# Patient Record
Sex: Female | Born: 2000 | Race: Black or African American | Hispanic: No | Marital: Single | Smoking: Never smoker
Health system: Southern US, Community
[De-identification: ages and names within clinical notes are randomized; demographics above are authoritative.]

## PROBLEM LIST (undated history)

## (undated) ENCOUNTER — Ambulatory Visit (HOSPITAL_COMMUNITY): Disposition: A | Discharge status: Home / Self Care

## (undated) DIAGNOSIS — Z789 Other specified health status: Secondary | ICD-10-CM

## (undated) HISTORY — DX: Other specified health status: Z78.9

## (undated) HISTORY — PX: NO PAST SURGERIES: SHX2092

---
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
## 2022-07-12 ENCOUNTER — Ambulatory Visit (INDEPENDENT_AMBULATORY_CARE_PROVIDER_SITE_OTHER)

## 2022-07-12 VITALS — BP 123/79 | HR 79 | Ht 66.0 in | Wt 135.2 lb

## 2022-07-12 DIAGNOSIS — Z3401 Encounter for supervision of normal first pregnancy, first trimester: Secondary | ICD-10-CM

## 2022-07-12 DIAGNOSIS — O3680X0 Pregnancy with inconclusive fetal viability, not applicable or unspecified: Secondary | ICD-10-CM

## 2022-07-12 DIAGNOSIS — Z3400 Encounter for supervision of normal first pregnancy, unspecified trimester: Secondary | ICD-10-CM | POA: Insufficient documentation

## 2022-07-12 NOTE — Progress Notes (Signed)
New OB Intake  I connected with  Katie Stevenson on 07/12/22 at  3:10 PM EDT by in person and verified that I am speaking with the correct person using two identifiers. Nurse is located at [facility] and pt is located at [place].  I discussed the limitations, risks, security and privacy concerns of performing an evaluation and management service by telephone and the availability of in person appointments. I also discussed with the patient that there may be a patient responsible charge related to this service. The patient expressed understanding and agreed to proceed.  I explained I am completing New OB Intake today. We discussed her EDD of 02/06/23 that is based on LMP of 05/02/22. Pt is G1/P0. I reviewed her allergies, medications, Medical/Surgical/OB history, and appropriate screenings. I informed her of [facility] services. [facility] information placed in AVS. Based on history, this is a/an  pregnancy uncomplicated .   There are no problems to display for this patient.   Concerns addressed today  Delivery Plans Plans to deliver at [facility]. Patient given information for [facility] Healthy [facility] website for more information about [facility]. Patient is not interested in water birth. Offered upcoming OB visit with CNM to discuss further.  MyChart/Babyscripts MyChart access verified. I explained pt will have some visits in office and some virtually. Babyscripts instructions given and order placed. Patient verifies receipt of registration text/e-mail. Account successfully created and app downloaded.  Blood Pressure Cuff/Weight Scale Patient has private insurance; instructed to purchase blood pressure cuff and bring to first prenatal appt. Explained after first prenatal appt pt will check weekly and document in Babyscripts. Patient does have weight scale. Patient has access to weight scale at home.  Anatomy US Explained first scheduled [place] will be around 19 weeks. Dating and viability scan performed  today. Anatomy US ordered. Date TBD.   Labs Discussed [name] genetic screening with patient. Would like both Panorama and Horizon drawn at new OB visit. Routine prenatal labs needed.  Covid Vaccine Patient has covid vaccine.   Is patient a CenteringPregnancy candidate?  Declined Declined due to Group Setting Not a candidate due to  Centering Patient" indicated on sticky note   Social Determinants of Health Food Insecurity: Patient denies food insecurity. WIC Referral: Patient is interested in referral to [facility].  Transportation: Patient denies transportation needs. Childcare: Discussed no children allowed at ultrasound appointments. Offered childcare services; patient declines childcare services at this time.  First visit review I reviewed new OB appt with pt. I explained she will have a provider visit that includes prenatal labs, std screening, genetic screening, and discuss plan of care for pregnancy.. Explained pt will be seen by Sharen Counter at first visit; encounter routed to appropriate provider. Explained that patient will be seen by pregnancy navigator following visit with provider.   Hamilton Capri, RN 07/12/2022  3:03 PM

## 2022-07-27 ENCOUNTER — Ambulatory Visit (INDEPENDENT_AMBULATORY_CARE_PROVIDER_SITE_OTHER): Admitting: Advanced Practice Midwife

## 2022-07-27 ENCOUNTER — Other Ambulatory Visit (HOSPITAL_COMMUNITY)
Admission: RE | Admit: 2022-07-27 | Discharge: 2022-07-27 | Disposition: A | Discharge status: Home / Self Care | Source: Ambulatory Visit | Attending: Advanced Practice Midwife | Admitting: Advanced Practice Midwife

## 2022-07-27 ENCOUNTER — Encounter: Payer: Self-pay | Admitting: Advanced Practice Midwife

## 2022-07-27 VITALS — BP 121/71 | HR 72 | Wt 134.3 lb

## 2022-07-27 DIAGNOSIS — Z3401 Encounter for supervision of normal first pregnancy, first trimester: Secondary | ICD-10-CM | POA: Diagnosis not present

## 2022-07-27 DIAGNOSIS — Z3A12 12 weeks gestation of pregnancy: Secondary | ICD-10-CM

## 2022-07-27 MED ORDER — ASPIRIN 81 MG PO CHEW: 81.0000 mg | CHEWABLE_TABLET | Freq: Every day | ORAL | 5 refills | Status: DC

## 2022-07-27 NOTE — Progress Notes (Signed)
Subjective:   Katie Stevenson is a 21 y.o. G1P0 at [redacted]w[redacted]d by LMP being seen today for her first obstetrical visit.  Her obstetrical history is significant for  none G1  and has Supervision of normal first teen pregnancy on their problem list.. Patient does intend to breast feed. Pregnancy history fully reviewed.  Patient reports no complaints.  HISTORY: OB History  Gravida Para Term Preterm AB Living  1 0 0 0 0 0  SAB IAB Ectopic Multiple Live Births  0 0 0 0 0    # Outcome Date GA Lbr Len/2nd Weight Sex Delivery Anes PTL Lv  1 Current            History reviewed. No pertinent past medical history. History reviewed. No pertinent surgical history. Family History  Problem Relation Age of Onset   Hypertension Mother    Hypertension Father    Diabetes Father    Social History   Tobacco Use   Smoking status: Never   Smokeless tobacco: Never  Vaping Use   Vaping Use: Never used  Substance Use Topics   Alcohol use: Never   Drug use: Never   No Known Allergies Current Outpatient Medications on File Prior to Visit  Medication Sig Dispense Refill   Prenatal Vit-Fe Fumarate-FA (PRENATAL VITAMINS PO) Take 1 tablet by mouth daily.     ondansetron (ZOFRAN ODT) 4 MG disintegrating tablet Take 1 tablet (4 mg total) by mouth every 8 (eight) hours as needed for nausea or vomiting. (Patient not taking: Reported on 07/27/2022) 20 tablet 0   No current facility-administered medications on file prior to visit.     Indications for ASA therapy (per uptodate) One of the following: Previous pregnancy with preeclampsia, especially early onset and with an adverse outcome No Multifetal gestation No Chronic hypertension No Type 1 or 2 diabetes mellitus No Chronic kidney disease No Autoimmune disease (antiphospholipid syndrome, systemic lupus erythematosus) No   Two or more of the following: Nulliparity Yes Obesity (body mass index >30 kg/m2) No Family history of preeclampsia in mother or  sister Yes Age ?35 years No Sociodemographic characteristics (African American race, low socioeconomic level) Yes Personal risk factors (eg, previous pregnancy with low birth weight or small for gestational age infant, previous adverse pregnancy outcome [eg, stillbirth], interval >10 years between pregnancies) No   Indications for early 1 hour GTT (per uptodate)  BMI >25 (>23 in Asian women) AND one of the following  Exam   Vitals:   07/27/22 1104  BP: 121/71  Pulse: 72  Weight: 134 lb 4.8 oz (60.9 kg)   Fetal Heart Rate (bpm): 164  Uterus:     Pelvic Exam: Perineum: no hemorrhoids, normal perineum   Vulva: normal external genitalia, no lesions   Vagina:  normal mucosa, normal discharge   Cervix: no lesions and normal, pap smear done.    Adnexa: normal adnexa and no mass, fullness, tenderness   Bony Pelvis: average  System: General: well-developed, well-nourished female in no acute distress   Breast:  normal appearance, no masses or tenderness   Skin: normal coloration and turgor, no rashes   Neurologic: oriented, normal, negative, normal mood   Extremities: normal strength, tone, and muscle mass, ROM of all joints is normal   HEENT PERRLA, extraocular movement intact and sclera clear, anicteric   Mouth/Teeth mucous membranes moist, pharynx normal without lesions and dental hygiene good   Neck supple and no masses   Cardiovascular: regular rate and rhythm  Respiratory:  no respiratory distress, normal breath sounds   Abdomen: soft, non-tender; bowel sounds normal; no masses,  no organomegaly     Assessment:   Pregnancy: G1P0 Patient Active Problem List   Diagnosis Date Noted   Supervision of normal first teen pregnancy 07/12/2022     Plan:  1. Supervision of normal first teen pregnancy in first trimester  - Panorama Prenatal Test Full Panel - [facility] Custom - Cervicovaginal ancillary only( [place]) - CBC/D/Plt+RPR+Rh+ABO+RubIgG... - Culture, OB Urine --BASA  for AA G1   Initial labs drawn. Continue prenatal vitamins. Discussed and offered genetic screening options, including Quad screen/AFP, NIPS testing, and option to decline testing. Benefits/risks/alternatives reviewed. Pt aware that anatomy US is form of genetic screening with lower accuracy in detecting trisomies than blood work.  Pt chooses genetic screening today. NIPS: requested. Ultrasound discussed; fetal anatomic survey: ordered. Problem list reviewed and updated. The nature of [facility] with multiple MDs and other Advanced Practice Providers was explained to patient; also emphasized that residents, students are part of our team. Routine obstetric precautions reviewed. No follow-ups on file.   Sharen Counter, CNM 07/27/22 11:33 AM

## 2022-07-28 LAB — CBC/D/PLT+RPR+RH+ABO+RUBIGG...
Antibody Screen: NEGATIVE
Basophils Absolute: 0 10*3/uL (ref 0.0–0.2)
Basos: 0 %
EOS (ABSOLUTE): 0 10*3/uL (ref 0.0–0.4)
Eos: 1 %
HCV Ab: NONREACTIVE
HIV Screen 4th Generation wRfx: NONREACTIVE
Hematocrit: 38.2 % (ref 34.0–46.6)
Hemoglobin: 12.5 g/dL (ref 11.1–15.9)
Hepatitis B Surface Ag: NEGATIVE
Immature Grans (Abs): 0 10*3/uL (ref 0.0–0.1)
Immature Granulocytes: 0 %
Lymphocytes Absolute: 1.6 10*3/uL (ref 0.7–3.1)
Lymphs: 24 %
MCH: 29.3 pg (ref 26.6–33.0)
MCHC: 32.7 g/dL (ref 31.5–35.7)
MCV: 90 fL (ref 79–97)
Monocytes Absolute: 0.3 10*3/uL (ref 0.1–0.9)
Monocytes: 5 %
Neutrophils Absolute: 4.7 10*3/uL (ref 1.4–7.0)
Neutrophils: 70 %
Platelets: 278 10*3/uL (ref 150–450)
RBC: 4.26 x10E6/uL (ref 3.77–5.28)
RDW: 15.1 % (ref 11.7–15.4)
RPR Ser Ql: NONREACTIVE
Rh Factor: NEGATIVE
Rubella Antibodies, IGG: 6.37 index (ref >0.99)
WBC: 6.7 10*3/uL (ref 3.4–10.8)

## 2022-07-28 LAB — CERVICOVAGINAL ANCILLARY ONLY
Chlamydia: NEGATIVE
Comment: NEGATIVE
Comment: NEGATIVE
Comment: NORMAL
Neisseria Gonorrhea: NEGATIVE
Trichomonas: NEGATIVE

## 2022-07-28 LAB — HCV INTERPRETATION

## 2022-07-30 LAB — URINE CULTURE, OB REFLEX

## 2022-07-30 LAB — CULTURE, OB URINE

## 2022-08-02 MED ORDER — NITROFURANTOIN MONOHYD MACRO 100 MG PO CAPS: 100.0000 mg | ORAL_CAPSULE | Freq: Two times a day (BID) | ORAL | 0 refills | Status: DC

## 2022-08-02 NOTE — Addendum Note (Signed)
Addended by: Sharen Counter A on: 08/02/2022 08:34 PM   Modules accepted: Orders

## 2022-08-03 ENCOUNTER — Telehealth: Payer: Self-pay | Admitting: *Deleted

## 2022-08-03 ENCOUNTER — Encounter: Payer: Self-pay | Admitting: Advanced Practice Midwife

## 2022-08-03 NOTE — Telephone Encounter (Signed)
TC from pt about panorama results. Does not want to know gender, just if the chromosomal testing was normal. Advised of low risk results. Advised pt to have her "gender reveal" person log in to [place].

## 2022-08-05 ENCOUNTER — Encounter: Payer: Self-pay | Admitting: Advanced Practice Midwife

## 2022-08-09 ENCOUNTER — Encounter: Payer: Self-pay | Admitting: Advanced Practice Midwife

## 2022-08-18 LAB — PANORAMA PRENATAL TEST FULL PANEL:PANORAMA TEST PLUS 5 ADDITIONAL MICRODELETIONS: FETAL FRACTION: 8.2

## 2022-08-18 LAB — HORIZON CUSTOM: REPORT SUMMARY: NEGATIVE

## 2022-08-24 ENCOUNTER — Encounter: Payer: Self-pay | Admitting: Advanced Practice Midwife

## 2022-08-24 ENCOUNTER — Ambulatory Visit (INDEPENDENT_AMBULATORY_CARE_PROVIDER_SITE_OTHER): Admitting: Advanced Practice Midwife

## 2022-08-24 VITALS — BP 122/76 | HR 74 | Wt 132.6 lb

## 2022-08-24 DIAGNOSIS — Z3401 Encounter for supervision of normal first pregnancy, first trimester: Secondary | ICD-10-CM

## 2022-08-24 DIAGNOSIS — Z3A19 19 weeks gestation of pregnancy: Secondary | ICD-10-CM

## 2022-08-24 DIAGNOSIS — Z3A16 16 weeks gestation of pregnancy: Secondary | ICD-10-CM

## 2022-08-24 NOTE — Progress Notes (Signed)
   PRENATAL VISIT NOTE  Subjective:  Katie Stevenson is a 21 y.o. G1P0 at [redacted]w[redacted]d being seen today for ongoing prenatal care.  She is currently monitored for the following issues for this low-risk pregnancy and has Supervision of normal first teen pregnancy on their problem list.  Patient reports no complaints.  Contractions: Not present. Vag. Bleeding: None.  Movement: Absent. Denies leaking of fluid.   The following portions of the patient's history were reviewed and updated as appropriate: allergies, current medications, past family history, past medical history, past social history, past surgical history and problem list.   Objective:   Vitals:   08/24/22 1525  BP: 122/76  Pulse: 74  Weight: 132 lb 9.6 oz (60.1 kg)    Fetal Status: Fetal Heart Rate (bpm): 153   Movement: Absent     General:  Alert, oriented and cooperative. Patient is in no acute distress.  Skin: Skin is warm and dry. No rash noted.   Cardiovascular: Normal heart rate noted  Respiratory: Normal respiratory effort, no problems with respiration noted  Abdomen: Soft, gravid, appropriate for gestational age.  Pain/Pressure: Absent     Pelvic: Cervical exam deferred        Extremities: Normal range of motion.  Edema: None  Mental Status: Normal mood and affect. Normal behavior. Normal judgment and thought content.   Assessment and Plan:  Pregnancy: G1P0 at [redacted]w[redacted]d 1. Supervision of normal first teen pregnancy in first trimester --Anticipatory guidance about next visits/weeks of pregnancy given.  - [place] MFM OB COMP + 14 WK; Future - AFP, Serum, Open Spina Bifida  2. [redacted] weeks gestation of pregnancy   Preterm labor symptoms and general obstetric precautions including but not limited to vaginal bleeding, contractions, leaking of fluid and fetal movement were reviewed in detail with the patient. Please refer to After Visit Summary for other counseling recommendations.   Return in about 4 weeks (around 09/21/2022) for LOB,  Any provider.  No future appointments.   Fatima Blank, CNM

## 2022-08-26 LAB — AFP, SERUM, OPEN SPINA BIFIDA
AFP MoM: 1.49
AFP Value: 60.5 ng/mL
Gest. Age on Collection Date: 16 weeks
Maternal Age At EDD: 19.6 yr
OSBR Risk 1 IN: 5617
Test Results:: NEGATIVE
Weight: 132 [lb_av]

## 2022-09-13 ENCOUNTER — Other Ambulatory Visit: Payer: Self-pay | Admitting: Advanced Practice Midwife

## 2022-09-13 ENCOUNTER — Ambulatory Visit: Attending: Advanced Practice Midwife

## 2022-09-13 ENCOUNTER — Other Ambulatory Visit: Payer: Self-pay | Admitting: *Deleted

## 2022-09-13 DIAGNOSIS — Z3402 Encounter for supervision of normal first pregnancy, second trimester: Secondary | ICD-10-CM | POA: Diagnosis not present

## 2022-09-13 DIAGNOSIS — O4442 Low lying placenta NOS or without hemorrhage, second trimester: Secondary | ICD-10-CM

## 2022-09-13 DIAGNOSIS — Z362 Encounter for other antenatal screening follow-up: Secondary | ICD-10-CM

## 2022-09-13 DIAGNOSIS — Z3A19 19 weeks gestation of pregnancy: Secondary | ICD-10-CM

## 2022-09-13 DIAGNOSIS — Z3401 Encounter for supervision of normal first pregnancy, first trimester: Secondary | ICD-10-CM | POA: Insufficient documentation

## 2022-09-13 DIAGNOSIS — Z3686 Encounter for antenatal screening for cervical length: Secondary | ICD-10-CM | POA: Insufficient documentation

## 2022-09-13 DIAGNOSIS — Z369 Encounter for antenatal screening, unspecified: Secondary | ICD-10-CM | POA: Insufficient documentation

## 2022-09-21 ENCOUNTER — Encounter: Payer: Self-pay | Admitting: Advanced Practice Midwife

## 2022-09-21 ENCOUNTER — Ambulatory Visit (INDEPENDENT_AMBULATORY_CARE_PROVIDER_SITE_OTHER): Admitting: Advanced Practice Midwife

## 2022-09-21 VITALS — BP 125/82 | HR 74 | Wt 137.8 lb

## 2022-09-21 DIAGNOSIS — Z3401 Encounter for supervision of normal first pregnancy, first trimester: Secondary | ICD-10-CM

## 2022-09-21 DIAGNOSIS — Z3A20 20 weeks gestation of pregnancy: Secondary | ICD-10-CM

## 2022-09-21 DIAGNOSIS — Z3402 Encounter for supervision of normal first pregnancy, second trimester: Secondary | ICD-10-CM

## 2022-09-21 NOTE — Progress Notes (Signed)
   PRENATAL VISIT NOTE  Subjective:  Katie Stevenson is a 21 y.o. G1P0 at [redacted]w[redacted]d being seen today for ongoing prenatal care.  She is currently monitored for the following issues for this low-risk pregnancy and has Supervision of normal first teen pregnancy on their problem list.  Patient reports no complaints.  Contractions: Not present. Vag. Bleeding: None.  Movement: Present. Denies leaking of fluid.   The following portions of the patient's history were reviewed and updated as appropriate: allergies, current medications, past family history, past medical history, past social history, past surgical history and problem list.   Objective:   Vitals:   09/21/22 1627  BP: 125/82  Pulse: 74  Weight: 137 lb 12.8 oz (62.5 kg)    Fetal Status: Fetal Heart Rate (bpm): 147 Fundal Height: 20 cm Movement: Present     General:  Alert, oriented and cooperative. Patient is in no acute distress.  Skin: Skin is warm and dry. No rash noted.   Cardiovascular: Normal heart rate noted  Respiratory: Normal respiratory effort, no problems with respiration noted  Abdomen: Soft, gravid, appropriate for gestational age.  Pain/Pressure: Absent     Pelvic: Cervical exam deferred        Extremities: Normal range of motion.  Edema: None  Mental Status: Normal mood and affect. Normal behavior. Normal judgment and thought content.   Assessment and Plan:  Pregnancy: G1P0 at [redacted]w[redacted]d 1. Supervision of normal first teen pregnancy in first trimester --Anticipatory guidance about next visits/weeks of pregnancy given.   2. [redacted] weeks gestation of pregnancy   Preterm labor symptoms and general obstetric precautions including but not limited to vaginal bleeding, contractions, leaking of fluid and fetal movement were reviewed in detail with the patient. Please refer to After Visit Summary for other counseling recommendations.   No follow-ups on file.  Future Appointments  Date Time Provider [place]  10/11/2022   2:30 PM [facility] NURSE [facility]  10/11/2022  2:45 PM [facility] US5 [facility]  10/18/2022  1:30 PM Leftwich-Kirby, Kathie Dike, CNM [facility] None    Fatima Blank, CNM

## 2022-10-11 ENCOUNTER — Other Ambulatory Visit: Payer: Self-pay | Admitting: *Deleted

## 2022-10-11 ENCOUNTER — Ambulatory Visit: Admitting: *Deleted

## 2022-10-11 ENCOUNTER — Encounter: Payer: Self-pay | Admitting: *Deleted

## 2022-10-11 ENCOUNTER — Ambulatory Visit: Attending: Obstetrics

## 2022-10-11 DIAGNOSIS — O4442 Low lying placenta NOS or without hemorrhage, second trimester: Secondary | ICD-10-CM | POA: Diagnosis present

## 2022-10-11 DIAGNOSIS — Z3A23 23 weeks gestation of pregnancy: Secondary | ICD-10-CM | POA: Diagnosis not present

## 2022-10-11 DIAGNOSIS — O365920 Maternal care for other known or suspected poor fetal growth, second trimester, not applicable or unspecified: Secondary | ICD-10-CM

## 2022-10-11 DIAGNOSIS — Z362 Encounter for other antenatal screening follow-up: Secondary | ICD-10-CM | POA: Diagnosis present

## 2022-10-18 ENCOUNTER — Encounter: Payer: Self-pay | Admitting: Advanced Practice Midwife

## 2022-10-18 ENCOUNTER — Ambulatory Visit (INDEPENDENT_AMBULATORY_CARE_PROVIDER_SITE_OTHER): Admitting: Advanced Practice Midwife

## 2022-10-18 VITALS — BP 122/77 | HR 72 | Wt 141.2 lb

## 2022-10-18 DIAGNOSIS — Z3A24 24 weeks gestation of pregnancy: Secondary | ICD-10-CM

## 2022-10-18 DIAGNOSIS — R21 Rash and other nonspecific skin eruption: Secondary | ICD-10-CM

## 2022-10-18 DIAGNOSIS — Z3401 Encounter for supervision of normal first pregnancy, first trimester: Secondary | ICD-10-CM

## 2022-10-18 MED ORDER — TRIAMCINOLONE ACETONIDE 0.5 % EX CREA: TOPICAL_CREAM | CUTANEOUS | 0 refills | Status: DC

## 2022-10-18 NOTE — Progress Notes (Signed)
Pt presents for ROB visit. Pt has a rash on her chest and upper back that showed up approx 1 week ago. Pt denies changes to laundry detergent or soap. No other concerns at this time.

## 2022-10-18 NOTE — Progress Notes (Signed)
   PRENATAL VISIT NOTE  Subjective:  Katie Stevenson is a 21 y.o. G1P0 at [redacted]w[redacted]d being seen today for ongoing prenatal care.  She is currently monitored for the following issues for this low-risk pregnancy and has Supervision of normal first teen pregnancy on their problem list.  Patient reports  rash .  Contractions: Not present. Vag. Bleeding: None.  Movement: Present. Denies leaking of fluid.   The following portions of the patient's history were reviewed and updated as appropriate: allergies, current medications, past family history, past medical history, past social history, past surgical history and problem list.   Objective:   Vitals:   10/18/22 1326  BP: 122/77  Pulse: 72  Weight: 141 lb 3.2 oz (64 kg)    Fetal Status: Fetal Heart Rate (bpm): 143 Fundal Height: 23 cm Movement: Present     General:  Alert, oriented and cooperative. Patient is in no acute distress.  Skin: Skin is warm and dry. No rash noted.   Cardiovascular: Normal heart rate noted  Respiratory: Normal respiratory effort, no problems with respiration noted  Abdomen: Soft, gravid, appropriate for gestational age.  Pain/Pressure: Absent     Pelvic: Cervical exam deferred        Extremities: Normal range of motion.  Edema: None  Mental Status: Normal mood and affect. Normal behavior. Normal judgment and thought content.   Assessment and Plan:  Pregnancy: G1P0 at [redacted]w[redacted]d 1. Supervision of normal first teen pregnancy in first trimester --Anticipatory guidance about next visits/weeks of pregnancy given.  --GTT at next visit  2. [redacted] weeks gestation of pregnancy   3. Rash and nonspecific skin eruption --Pt using topical hydrocortisone cream without improvement --Rash started 2 weeks ago, no itching or burning, just visible irregular papules on chest and back --Pt using unscented detergent, soap, lotion, no recent changes  - triamcinolone cream (KENALOG) 0.5 %; Use twice daily for 7 days.  Repeat for another 7 days  if needed.  Dispense: 30 g; Refill: 0   Preterm labor symptoms and general obstetric precautions including but not limited to vaginal bleeding, contractions, leaking of fluid and fetal movement were reviewed in detail with the patient. Please refer to After Visit Summary for other counseling recommendations.   Return in about 4 weeks (around 11/15/2022) for GTT at next visit.  Future Appointments  Date Time Provider Department Center  11/02/2022  8:30 AM [facility] NURSE [facility]  11/02/2022  8:45 AM [facility] US6 [facility]  11/16/2022  8:15 AM [facility] LAB [facility] None  11/16/2022  8:55 AM Leftwich-Kirby, Wilmer Floor, CNM [facility] None    Sharen Counter, CNM

## 2022-11-02 ENCOUNTER — Other Ambulatory Visit: Payer: Self-pay | Admitting: *Deleted

## 2022-11-02 ENCOUNTER — Ambulatory Visit: Attending: Obstetrics and Gynecology

## 2022-11-02 ENCOUNTER — Ambulatory Visit: Admitting: *Deleted

## 2022-11-02 VITALS — BP 114/66 | HR 62

## 2022-11-02 DIAGNOSIS — Z3A26 26 weeks gestation of pregnancy: Secondary | ICD-10-CM | POA: Insufficient documentation

## 2022-11-02 DIAGNOSIS — O283 Abnormal ultrasonic finding on antenatal screening of mother: Secondary | ICD-10-CM

## 2022-11-02 DIAGNOSIS — O358XX0 Maternal care for other (suspected) fetal abnormality and damage, not applicable or unspecified: Secondary | ICD-10-CM | POA: Diagnosis not present

## 2022-11-02 DIAGNOSIS — O4442 Low lying placenta NOS or without hemorrhage, second trimester: Secondary | ICD-10-CM | POA: Diagnosis not present

## 2022-11-02 DIAGNOSIS — Z362 Encounter for other antenatal screening follow-up: Secondary | ICD-10-CM | POA: Insufficient documentation

## 2022-11-02 DIAGNOSIS — Z3402 Encounter for supervision of normal first pregnancy, second trimester: Secondary | ICD-10-CM | POA: Insufficient documentation

## 2022-11-02 DIAGNOSIS — O09892 Supervision of other high risk pregnancies, second trimester: Secondary | ICD-10-CM

## 2022-11-02 DIAGNOSIS — O365920 Maternal care for other known or suspected poor fetal growth, second trimester, not applicable or unspecified: Secondary | ICD-10-CM | POA: Insufficient documentation

## 2022-11-03 ENCOUNTER — Ambulatory Visit (HOSPITAL_COMMUNITY): Admission: EM | Admit: 2022-11-03 | Discharge: 2022-11-03 | Discharge status: Against Medical Advice

## 2022-11-16 ENCOUNTER — Encounter: Payer: Self-pay | Admitting: Obstetrics and Gynecology

## 2022-11-16 ENCOUNTER — Ambulatory Visit (INDEPENDENT_AMBULATORY_CARE_PROVIDER_SITE_OTHER): Admitting: Obstetrics and Gynecology

## 2022-11-16 ENCOUNTER — Other Ambulatory Visit

## 2022-11-16 VITALS — BP 123/81 | HR 74 | Wt 148.6 lb

## 2022-11-16 DIAGNOSIS — Z3A28 28 weeks gestation of pregnancy: Secondary | ICD-10-CM

## 2022-11-16 DIAGNOSIS — Z3403 Encounter for supervision of normal first pregnancy, third trimester: Secondary | ICD-10-CM

## 2022-11-16 DIAGNOSIS — Z23 Encounter for immunization: Secondary | ICD-10-CM | POA: Diagnosis not present

## 2022-11-16 NOTE — Progress Notes (Signed)
Pt presents for ROB visit. Pt request flu and Tdap vaccine today. No concerns at this time.

## 2022-11-16 NOTE — Patient Instructions (Signed)
AREA PEDIATRIC/FAMILY PRACTICE PHYSICIANS  [place] ([address]) [facility] Chambliss, MD; Eniola, MD; Hale, MD; Hensel, MD; McDiarmid, MD; McIntyer, MD; Neal, MD; Walden, MD [address] [place], [place] [address] [phone] Mon-Fri 8:30-12:30, 1:30-5:00 Providers come to see babies at [facility] Accepting Medicaid [facility] providers who accept newborns: Koirala, MD; Morrow, MD; Wolters, MD [address], [place], [place] [address] [phone] Mon-Fri 8:00-5:30 Babies seen by providers at [facility] Does NOT accept Medicaid Please call early in hospitalization for appointment (limited availability)  [facility] Mulberry, MD [address] [place], [place] [address] [phone] Mon, Tue, Thur, Fri 8:30-5:00, Wed 10:00-7:00 (closed 1-2pm) Babies seen by [facility] providers Accepting Medicaid Rubin - Pediatrician Rubin, MD [address], [place], [place] [address] [phone] Mon-Fri 8:30-5:00, Sat 8:30-12:00 Provider comes to see babies at [facility] Accepting Medicaid Must have been referred from current patients or contacted office prior to delivery [facility] ([facility]) Brown, MD; Chandler, MD; Ettefagh, MD; Grant, MD; Lester, MD; McCormick, MD; McQueen, MD; Prose, MD; Simha, MD; Stanley, MD; Stryffeler, NP; Tebben, NP [address], [place], [place] [address] [phone] Mon, Tue, Thur, Fri 8:30-5:30, Wed 9:30-5:30, Sat 8:30-12:30 Babies seen by [facility] providers Accepting Medicaid Only accepting infants of first-time parents or siblings of current patients Hospital discharge coordinator will make follow-up appointment Jack Amos [address], [place], [place]  [address] [phone]   Fax - [phone] [facility] [address], [place], [place]  [address] Phone - [phone]   Fax - [phone] Shilpa Gosrani [address], [place], [place]  [address] [phone]  [place] ([address]) [facility] Bates, MD; [place]assfield, MD; Cooper, Cox, MD; MD; Davis, MD; Dovico, MD; Ettefaugh, MD; Little, MD; Lowe, MD; Keiffer, MD; Melvin, MD; Sumner, MD; Williams, MD [address], [place], [place] [address] [phone] Mon-Fri 8:30-5:00 (extended evenings Mon-Thur as needed), Sat-Sun 10:00-1:00 Providers come to see babies at [facility] Accepting Medicaid for families of first-time babies and families with all children in the household age 3 and under. Must register with office prior to making appointment (M-F only). [facility] Henson, NP; Knapp, MD; Lalonde, MD; Tysinger, PA [address] [place], [place] [address] [phone] Mon-Fri 8:00-5:00 Babies seen by providers at [facility] Does NOT accept Medicaid/Commercial Insurance Only [facility] ([facility])  Artis, MD; Barnes, MD; Bratton, MD; Coccaro, MD; [place] Gardner, MD; Kramer, MD; Marshall, MD; Netherton, MD; Poleto, MD; Skinner, MD [address] [place], [place] [address] [phone] Mon-Fri 8:30-5:30, Sat (Oct.-Mar.) 9:00-1:00 Babies seen by providers at [facility] Accepting Medicaid  [place] ([address]) [facility] Reid, MD; Warner, MD [address], [place], [place] [address] [phone] Mon-Fri 8:30-5:00, Sat 8:30-12:00 Providers come to see babies at [facility] Does NOT accept Medicaid [facility] Becker, PA; Hagler, MD; Scifres, PA; Sun, MD; Swayne, MD [address], [place], [place] [address] [phone] Mon-Fri 8:00-5:00 Babies seen by providers at [facility] Does NOT accept Medicaid Only accepting babies of parents who  are patients Please call early in hospitalization for appointment (limited availability) [facility] Clark, MD; Frye, MD; Kelleher, MD; Mack, NP; Miller, MD; O'Keller, MD; Patterson, NP; Pudlo, MD; Puzio, MD; Thomas, MD; Tucker, MD; Twiselton, MD [address], [place], [place] [address] [phone] Mon-Fri 8:00-5:00, Sat 9:00-12:00 Providers come to see babies at [facility] Does NOT accept Medicaid  [place] ([address]) [facility] providers accepting new patients: Brake, NP; Wharton, PA [address], [place], [place] [address] [phone] Mon-Fri 8:00-5:00 Babies seen by providers at [facility] Does NOT accept Medicaid Only accepting babies of parents who are patients Please call early in hospitalization for appointment (limited availability) [facility] Gay, MD; Quinlan, MD [address] [place], [place] [address] [phone] (press 1 to schedule appointment) Mon-Fri 8:00-5:00 Providers come to see babies at [facility] Does NOT accept Medicaid [facility] Mazer, MD [address] [place], [place] [address] [phone] Mon-Fri 8:30-5:00 (lunch 12:30-1:00), extended hours by appointment only Wed 5:00-6:30 Babies seen by [facility] providers Accepting Medicaid [facility] Banks, MD; Jordan, MD; Koberlein, MD [address], [place], [place] [address] [phone] Mon-Fri 8:00-5:00 Babies seen by [facility] providers Does NOT accept Medicaid [facility] Parker, MD; Hunter, MD; Wallace, DO [address] [place], [place] [address] [phone] Mon-Fri 8:00-5:00 Babies seen by [facility] providers Does NOT accept Medicaid [facility] Brandon, PA; Brecken, PA; Christy, NP; Dees, MD; DeClaire, MD; DeWeese, MD; Hansen, NP; Mills, NP; Parrish, NP; Smoot, NP; Summer, MD; Vapne,  MD [address] [place], [place] [address] [phone] Mon-Fri 8:30-5:00, Sat 10:00-1:00 Providers come to see babies at [facility] Does NOT accept Medicaid Free prenatal information session Tuesdays at 4:45pm [facility] Bouska, MD; Gordon, PA; Jeffery, PA; Weber, PA [address] [place] [address] [phone] Mon-Fri 7:30-5:30 Babies seen by [facility] providers [facility] [address], [place], [place]  [address] [phone]   Fax - [phone]  [place] ([address] & [address]) [facility] [place] [address] [place], [place] [address] [phone] Mon-Thur 8:00-6:00 Providers come to see babies at [facility] Accepting Medicaid [facility] Anderson, NP; Badger, MD; Beal, PA; Spencer, PA [address] [place], [place] [address] [phone] Mon-Thur 7:30-7:30, Fri 7:30-4:30 Babies seen by [facility] providers Accepting Medicaid [facility] Agbuya, MD; Klett, NP; Romgoolam, MD [address], [place], [place] [address] [phone] Mon-Fri 8:30-5:00, Sat 8:30-12:00 Providers come to see babies at [facility] Accepting Medicaid Must have "Meet & Greet" appointment at office prior to delivery [facility] ([facility] McCord, MD; Wallace, MD; Wood, MD [address], [place], [place] [address] [phone] Mon-Wed 8:00-6:00, Thur-Fri 8:00-5:00, Sat 9:00-12:00 Providers come to see babies at [facility] Does NOT accept Medicaid Only accepting siblings of current patients [facility]  [address], [place], [place]  [address] [phone]   Fax - [phone] [facility] [address], [place], [place]  [address] [phone]   Fax -  [phone]  [place] ([address] & [address]) [facility] Cirigliano, DO; Matthews, DO [address] [place], [place] [address] [phone] Mon-Fri 7:00-5:00 Babies seen by [facility] providers Does NOT accept Medicaid [facility] Briscoe, MD; Howley, PA; [place], PA [address], [place], [place] [address] [phone] Mon-Fri 8:00-5:00 Babies seen by [facility] providers Accepting Medicaid [facility] Boyd, MD; Church, PA; Jones, NP; Osborn, PA [address], [place], [place] [address] [phone] Mon-Fri 8:00-5:00 Babies seen by providers at [facility] Accepting Medicaid  [place] ([address]) [facility] [place] Wendling, DO [address] [place], [place] [address] [phone] Mon-Fri 8:00-5:00 Babies seen by [facility] providers Does NOT accept Medicaid Limited availability, please call early in hospitalization to schedule follow-up [facility] Calderon, PA; Cummings, MD; Dillard, MD; Martin, PA; Olson, MD; VanDeven, PA [address], [place], [place] [address] [phone] Mon-Fri 8:30-5:00, Sat 9:00-12:00 Babies seen by providers at [facility] Accepting Medicaid Please register online then schedule online or call office [email or web address] Hunter, NP; Kumar, MD; Martin Rogers, PA [address], [place], [place] [address] [phone] Mon-Fri 8:00-5:00 Babies seen by providers at [facility] Accepting Medicaid [facility] ([facility]) [place], MD; Kristi Fleenor, NP; West, MD [address], [place], [place] [address] [phone] Mon-Fri 8:00-5:30, Sat&Sun by appointment (phones open at  8:30) Babies seen by [facility] providers Accepting Medicaid Must be a first-time baby or sibling of current patient [facility] [place]  [address], [place], [place]  [address] [phone]   Fax - [phone]  [place] ([address]) [facility] Brown, PA; Cowen, PA; Rice, MD; Helton, PA; Spry, MD [address] [place], [place] [address] [phone] Mon-Thur 8:00-7:00, Fri 8:00-5:00, Sat 8:00-12:00, Sun 9:00-12:00 Babies seen by [facility] providers Accepting Medicaid [facility] [place] Coe-Goins, MD; Marshall, MD; Pierre-Louis, MD [address] [place] [address], [place], [place] [address] [phone] Mon-Thur 8:00-5:00 Babies seen by providers at [facility] Accepting Medicaid [facility] Bratton, MD; Coe-Goins, MD; Hayes, MD; Lewis, MD; List, MD; Lott, MD; Marshall, MD; Moran, MD; O'Neal, MD; Pierre-Louis, MD; Pitonzo, MD; Scholer, MD; Spangle, MD [address] [place], [place] [address] [phone] Mon-Fri 8:00-5:30, Sat (Oct.-Mar.) 9:00-1:00 Babies seen by providers at [facility] Accepting Medicaid Must fill out new patient packet, available online at [email or web address] [facility] ([facility]) Friddle, NP; Harris, NP; Kelly, NP; Logan, MD; Melvin, PA; Poth, MD; Ramadoss, MD; Stanton, NP [address], [place], [place] [address] [phone] Mon-Thur 8:00-5:30, Fri 8:00-5:00 Babies seen by providers at [facility] Accepting Medicaid  [place] ([address]) [facility] Dixon, PA; Corwin, MD; Pickard, MD; Tapia, PA [address], [place], [place] [address] [phone] Mon-Fri 8:00-5:00 Babies seen by providers at [facility] Accepting Medicaid   [place] ([address]) [facility] [place] Masneri, DO; Meyers, MD; Nelson, PA [address], [place], [place] [address] [phone] Mon-Fri 8:00-5:00 Babies seen by providers at [facility] Does NOT accept Medicaid Limited appointment availability, please call early in hospitalization  [facility] [place] Kunedd, DO; McGowen, MD [address], [place], [place] [address] [phone] Mon-Fri 8:00-5:00 Babies seen by [facility] providers Does NOT accept Medicaid [facility] [place] Cameron, MD; MacDonald, MD; Michaels, PA; Nayak, MD [address] [place] [address], [place], [place] [address] [phone] Mon-Fri 8:00-5:00 After hours clinic ([address] , [place] [address]) [phone] Mon-Fri 5:00-8:00, Sat 12:00-6:00, Sun 10:00-4:00 Babies seen by [facility] providers Accepting Medicaid [facility] [place] [address], [place], [place]  [address] [phone]   Fax - [phone]  [place] ([address]) [facility] [place] Andy, MD [address], [place], [place] [address] [phone] Mon-Fri 8:00-5:00 Babies seen by [facility] providers Does NOT accept Medicaid [facility] [place] ([facility] [place]) Eksir, MD [address], [place], [place] [address] [phone] Mon-Thur 8:00-7:00, Fri 8:00-5:00, Sat 8:00-12:00 Babies seen by providers at [facility] Accepting Medicaid - but does not have vaccinations in office (must be received elsewhere) Limited availability, please call early in hospitalization  [place] ([address]) [facility]  Charlene Flemming, MD [address], [place] [address] [phone]  Fax [phone]  [facility]  Kimberly Newton, MD, Annamarie Streilein, PA, Carla Hampton, PA [address] [place] [place], [place]  [address] [phone] [facility]  [address], [place], [place] [address] [phone] [address], [place], [place] [address] [phone] (West Office)  [facility] [address], [place], [place] [address] [phone] [facility] [address] [place], [place] [address] [phone] [facility] [address], [place], [place] [address] [phone] [facility] [address], [place], [place] [address] [phone] [facility] [address], [place], [place] [address] [phone] [facility] [address], [place], [place] [address] [phone] [facility]  [address], [place], [place] [address] [phone] Dr[facility] [address] [place] [address], [place],  [address] [phone] [facility] [address], [place],  [address] [phone] [facility] [address], [place],  [address] [phone]  

## 2022-11-16 NOTE — Progress Notes (Signed)
PRENATAL VISIT NOTE  Subjective:  Katie Stevenson is a 21 y.o. G1P0 at [redacted]w[redacted]d being seen today for ongoing prenatal care.  She is currently monitored for the following issues for this low-risk pregnancy and has Supervision of normal first teen pregnancy on their problem list.  Patient reports no complaints.  Contractions: Not present. Vag. Bleeding: None.  Movement: Present. Denies leaking of fluid.   The following portions of the patient's history were reviewed and updated as appropriate: allergies, current medications, past family history, past medical history, past social history, past surgical history and problem list.   Objective:   Vitals:   11/16/22 0826  BP: 123/81  Pulse: 74  Weight: 148 lb 9.6 oz (67.4 kg)    Fetal Status: Fetal Heart Rate (bpm): 140 Fundal Height: 27 cm Movement: Present     General:  Alert, oriented and cooperative. Patient is in no acute distress.  Skin: Skin is warm and dry. No rash noted.   Cardiovascular: Normal heart rate noted  Respiratory: Normal respiratory effort, no problems with respiration noted  Abdomen: Soft, gravid, appropriate for gestational age.  Pain/Pressure: Absent     Pelvic: Cervical exam deferred        Extremities: Normal range of motion.  Edema: None  Mental Status: Normal mood and affect. Normal behavior. Normal judgment and thought content.   [place] MFM OB FOLLOW UP  Result Date: 11/02/2022 ----------------------------------------------------------------------  OBSTETRICS REPORT                       (Signed Final 11/02/2022 09:47 am) ---------------------------------------------------------------------- Patient Info  ID #:       [id number]                          D.O.B.:  07-30-2001 (21 yrs)  Name:       Katie Stevenson                     Visit Date: 11/02/2022 07:25 am ---------------------------------------------------------------------- Performed By  Attending:        Braxton Feathers DO       Ref. Address:     [address]                                                             [address]                                                             [place]  [address]  Performed By:     Stephenie Acres        Location:         [facility]                                  [facility]                                                             [facility]                                                             [facility]  Referred By:      [facility] ---------------------------------------------------------------------- Orders  #  Description                           Code        Ordered By  1  [place] MFM OB FOLLOW UP                   [id number]    [place] ----------------------------------------------------------------------  #  Order #                     Accession #                Episode #  1  [id number]                   [id number]                 [id number] ---------------------------------------------------------------------- Indications  Poor fetal growth (EFW/AC 10%)                 O35.8XX0  Encounter for other antenatal screening        Z36.2  follow-up  [redacted] weeks gestation of pregnancy                Z3A.26  Low lying placenta, antepartum (resolved)      O44.40  NIPS LR female, AFP neg, Horizon neg ---------------------------------------------------------------------- Fetal Evaluation  Num Of Fetuses:         1  Fetal Heart Rate(bpm):  148  Cardiac Activity:       Observed  Presentation:           Cephalic  Placenta:               Posterior  P. Cord Insertion:      Previously Visualized  Amniotic Fluid  AFI FV:      Within normal limits                              Largest Pocket(cm)  3.5 ---------------------------------------------------------------------- Biometry  BPD:      63.1  mm     G. Age:  25w 4d         18  %    CI:        75.78   %    70 - 86                                                           FL/HC:      20.5   %    18.6 - 20.4  HC:      229.8  mm     G. Age:  25w 0d        2.7  %    HC/AC:      1.12        1.04 - 1.22  AC:       205   mm     G. Age:  25w 1d         11  %    FL/BPD:     74.8   %    71 - 87  FL:       47.2  mm     G. Age:  25w 5d         21  %    FL/AC:      23.0   %    20 - 24  LV:        4.4  mm  Est. FW:     799  gm    1 lb 12 oz      10  % ---------------------------------------------------------------------- OB History  Gravidity:    1 ---------------------------------------------------------------------- Gestational Age  LMP:           26w 2d        Date:  05/02/22                   EDD:   02/06/23  U/S Today:     25w 3d                                        EDD:   02/12/23  Best:          26w 2d     Det. By:  LMP  (05/02/22)          EDD:   02/06/23 ---------------------------------------------------------------------- Anatomy  Cranium:               Appears normal         Aortic Arch:            Previously seen  Cavum:                 Appears normal         Ductal Arch:            Previously seen  Ventricles:            Appears normal         Diaphragm:              Appears normal  Choroid Plexus:        Previously seen  Stomach:                Appears normal, left                                                                        sided  Cerebellum:            Previously seen        Abdomen:                Previously seen  Posterior Fossa:       Previously seen        Abdominal Wall:         Previously seen  Nuchal Fold:           Not applicable ([id number]    Cord Vessels:           Previously seen                         wks GA)  Face:                  Orbits and profile     Kidneys:                Appear normal                         previously seen  Lips:                  Previously seen        Bladder:                Appears normal  Thoracic:              Appears normal         Spine:                  Previously seen   Heart:                 Appears normal; EIF    Upper Extremities:      Previously seen  RVOT:                  Previously seen        Lower Extremities:      Previously seen  LVOT:                  Previously seen  Other:  Fetus appears to be a female. Heels/feet and open hands/5th digits,          Nasal bone, lenses, maxilla, mandible,falx,VC, 3VV and 3VTV          previously visualized. All anatomy now visualized ---------------------------------------------------------------------- Doppler - Fetal Vessels  Umbilical Artery   S/D     %tile      RI    %tile      PI    %tile     PSV    ADFV    RDFV                                                     (  cm/s)    2.5       14     0.6       14     0.9       19     37.5      No      No ---------------------------------------------------------------------- Comments  Follow-up growth ultrasound at 26w 2d with EDD of  02/06/2023 dated by LMP  (05/02/22). Pregnancy  complicated by teen pregnancy and low-normal fetal growth.  Sonographic findings  Single intrauterine pregnancy at 26w 2d.  Observed fetal cardiac activity.  Cephalic presentation.  Interval fetal anatomy appears normal.  Fetal biometry shows the estimated fetal weight at the 10  percentile. [facility] is at the 11th percentile.  Amniotic fluid volume: Within normal limits. MVP: 3.5 cm.  Placenta: Posterior.  Recommendations  - F/u in 3 to 4 weeks due to low normal fetal growth ----------------------------------------------------------------------                  Valeda Malm, DO Electronically Signed Final Report   11/02/2022 09:47 am ----------------------------------------------------------------------   Assessment and Plan:  Pregnancy: G1P0 at [redacted]w[redacted]d 1. Supervision of normal first teen pregnancy in third trimester Patient is doing well  Third trimester labs with glucola today Continue ASA Follow up growth ultrasound on 12/03/22 due to low normal fetal growth  Preterm labor symptoms and general obstetric  precautions including but not limited to vaginal bleeding, contractions, leaking of fluid and fetal movement were reviewed in detail with the patient. Please refer to After Visit Summary for other counseling recommendations.   Return in about 2 weeks (around 11/30/2022) for in person, ROB, Low risk.  Future Appointments  Date Time Provider [place]  11/16/2022  8:55 AM Kordell Jafri, Vickii Chafe, MD [place] None  11/30/2022  9:35 AM Shelly Bombard, MD [facility] None  12/03/2022 11:15 AM [facility] NURSE [facility]  12/03/2022 11:30 AM [facility] US2 [facility]    Mora Bellman, MD

## 2022-11-18 LAB — CBC
Hematocrit: 35.9 % (ref 34.0–46.6)
Hemoglobin: 12.2 g/dL (ref 11.1–15.9)
MCH: 32.1 pg (ref 26.6–33.0)
MCHC: 34 g/dL (ref 31.5–35.7)
MCV: 95 fL (ref 79–97)
Platelets: 203 10*3/uL (ref 150–450)
RBC: 3.8 x10E6/uL (ref 3.77–5.28)
RDW: 12.7 % (ref 11.7–15.4)
WBC: 5.6 10*3/uL (ref 3.4–10.8)

## 2022-11-18 LAB — HIV ANTIBODY (ROUTINE TESTING W REFLEX): HIV Screen 4th Generation wRfx: NONREACTIVE

## 2022-11-18 LAB — GLUCOSE TOLERANCE, 2 HOURS W/ 1HR
Glucose, 1 hour: 87 mg/dL (ref 70–179)
Glucose, 2 hour: 52 mg/dL — ABNORMAL LOW (ref 70–152)
Glucose, Fasting: 74 mg/dL (ref 70–91)

## 2022-11-18 LAB — RPR: RPR Ser Ql: NONREACTIVE

## 2022-11-29 NOTE — L&D Delivery Note (Addendum)
Delivery Note At 20 a viable female infant was delivered via SVD, presentation: LOA. APGAR: 8, 9; weight pending.   Placenta status: spontaneously delivered intact with gentle cord traction. Fundus firm with massage and Pitocin. Had brief episode of moderate amt of bleeding even with massage, Methergine given and bleeding slowed.  Anesthesia: epidural Lacerations: 2nd degree and periurethral Suture used for repair: 2-0, 3-0 Vicryl rapide Est. Blood Loss (mL): 256 Placenta to LD Complications none Cord ph n/a   Mom to postpartum. Baby to Couplet care / Skin to Skin.    Julianne Handler, CNM 01/17/2023 10:55 AM

## 2022-11-30 ENCOUNTER — Ambulatory Visit (INDEPENDENT_AMBULATORY_CARE_PROVIDER_SITE_OTHER): Admitting: Obstetrics

## 2022-11-30 ENCOUNTER — Encounter: Payer: Self-pay | Admitting: Obstetrics

## 2022-11-30 VITALS — BP 116/76 | HR 70 | Wt 150.4 lb

## 2022-11-30 DIAGNOSIS — Z3403 Encounter for supervision of normal first pregnancy, third trimester: Secondary | ICD-10-CM

## 2022-11-30 DIAGNOSIS — Z3A30 30 weeks gestation of pregnancy: Secondary | ICD-10-CM

## 2022-11-30 NOTE — Progress Notes (Signed)
Subjective:  Katie Stevenson is a 22 y.o. G1P0 at [redacted]w[redacted]d being seen today for ongoing prenatal care.  She is currently monitored for the following issues for this low-risk pregnancy and has Supervision of normal first teen pregnancy on their problem list.  Patient reports no complaints.  Contractions: Not present. Vag. Bleeding: None.  Movement: Present. Denies leaking of fluid.   The following portions of the patient's history were reviewed and updated as appropriate: allergies, current medications, past family history, past medical history, past social history, past surgical history and problem list. Problem list updated.  Objective:   Vitals:   11/30/22 1004  BP: 116/76  Pulse: 70  Weight: 150 lb 6.4 oz (68.2 kg)    Fetal Status: Fetal Heart Rate (bpm): 140   Movement: Present     General:  Alert, oriented and cooperative. Patient is in no acute distress.  Skin: Skin is warm and dry. No rash noted.   Cardiovascular: Normal heart rate noted  Respiratory: Normal respiratory effort, no problems with respiration noted  Abdomen: Soft, gravid, appropriate for gestational age. Pain/Pressure: Absent     Pelvic:  Cervical exam deferred        Extremities: Normal range of motion.  Edema: None  Mental Status: Normal mood and affect. Normal behavior. Normal judgment and thought content.   Urinalysis:      Assessment and Plan:  Pregnancy: G1P0 at [redacted]w[redacted]d  1. Supervision of normal first teen pregnancy in third trimester   Preterm labor symptoms and general obstetric precautions including but not limited to vaginal bleeding, contractions, leaking of fluid and fetal movement were reviewed in detail with the patient. Please refer to After Visit Summary for other counseling recommendations.   Return in about 2 weeks (around 12/14/2022) for ROB.   Shelly Bombard, MD 11/30/2022

## 2022-11-30 NOTE — Progress Notes (Signed)
Patient presents for ROB. Patient has no concerns today. 

## 2022-12-03 ENCOUNTER — Ambulatory Visit: Attending: Maternal & Fetal Medicine

## 2022-12-03 ENCOUNTER — Ambulatory Visit: Admitting: *Deleted

## 2022-12-03 VITALS — BP 120/74 | HR 88

## 2022-12-03 DIAGNOSIS — Z3A30 30 weeks gestation of pregnancy: Secondary | ICD-10-CM | POA: Diagnosis not present

## 2022-12-03 DIAGNOSIS — O283 Abnormal ultrasonic finding on antenatal screening of mother: Secondary | ICD-10-CM | POA: Insufficient documentation

## 2022-12-03 DIAGNOSIS — O365930 Maternal care for other known or suspected poor fetal growth, third trimester, not applicable or unspecified: Secondary | ICD-10-CM

## 2022-12-03 DIAGNOSIS — O35BXX0 Maternal care for other (suspected) fetal abnormality and damage, fetal cardiac anomalies, not applicable or unspecified: Secondary | ICD-10-CM

## 2022-12-03 DIAGNOSIS — O09893 Supervision of other high risk pregnancies, third trimester: Secondary | ICD-10-CM | POA: Diagnosis present

## 2022-12-03 DIAGNOSIS — O09892 Supervision of other high risk pregnancies, second trimester: Secondary | ICD-10-CM | POA: Insufficient documentation

## 2022-12-08 ENCOUNTER — Other Ambulatory Visit: Payer: Self-pay | Admitting: *Deleted

## 2022-12-08 DIAGNOSIS — Z362 Encounter for other antenatal screening follow-up: Secondary | ICD-10-CM

## 2022-12-08 DIAGNOSIS — O09893 Supervision of other high risk pregnancies, third trimester: Secondary | ICD-10-CM

## 2022-12-14 ENCOUNTER — Ambulatory Visit (INDEPENDENT_AMBULATORY_CARE_PROVIDER_SITE_OTHER): Admitting: Advanced Practice Midwife

## 2022-12-14 VITALS — BP 116/70 | HR 85 | Wt 151.1 lb

## 2022-12-14 DIAGNOSIS — Z3A32 32 weeks gestation of pregnancy: Secondary | ICD-10-CM

## 2022-12-14 DIAGNOSIS — O26893 Other specified pregnancy related conditions, third trimester: Secondary | ICD-10-CM | POA: Diagnosis not present

## 2022-12-14 DIAGNOSIS — Z6791 Unspecified blood type, Rh negative: Secondary | ICD-10-CM | POA: Diagnosis not present

## 2022-12-14 DIAGNOSIS — Z3403 Encounter for supervision of normal first pregnancy, third trimester: Secondary | ICD-10-CM

## 2022-12-14 MED ORDER — RHO D IMMUNE GLOBULIN 1500 UNIT/2ML IJ SOSY
300.0000 ug | PREFILLED_SYRINGE | Freq: Once | INTRAMUSCULAR | Status: AC
Start: 2022-12-14 — End: 2022-12-14
  Administered 2022-12-14: 300 ug via INTRAMUSCULAR

## 2022-12-14 NOTE — Progress Notes (Signed)
Pt reports fetal movement, states no concerns today. Administered rhogam in [place] and pt tolerated well .Marland Kitchen Administrations This Visit     rho (d) immune globulin (RHIG/RHOPHYLAC) injection 300 mcg     Admin Date 12/14/2022 Action Given Dose 300 mcg Route Intramuscular Administered By Hinton Lovely, RN

## 2022-12-14 NOTE — Progress Notes (Signed)
   PRENATAL VISIT NOTE  Subjective:  Katie Stevenson is a 22 y.o. G1P0 at [redacted]w[redacted]d being seen today for ongoing prenatal care.  She is currently monitored for the following issues for this low-risk pregnancy and has Supervision of normal first teen pregnancy on their problem list.  Patient reports no complaints.  Contractions: Not present. Vag. Bleeding: None.  Movement: Present. Denies leaking of fluid.   The following portions of the patient's history were reviewed and updated as appropriate: allergies, current medications, past family history, past medical history, past social history, past surgical history and problem list.   Objective:   Vitals:   12/14/22 1017  BP: 116/70  Pulse: 85  Weight: 151 lb 1.6 oz (68.5 kg)    Fetal Status: Fetal Heart Rate (bpm): 143   Movement: Present     General:  Alert, oriented and cooperative. Patient is in no acute distress.  Skin: Skin is warm and dry. No rash noted.   Cardiovascular: Normal heart rate noted  Respiratory: Normal respiratory effort, no problems with respiration noted  Abdomen: Soft, gravid, appropriate for gestational age.  Pain/Pressure: Absent     Pelvic: Cervical exam deferred        Extremities: Normal range of motion.     Mental Status: Normal mood and affect. Normal behavior. Normal judgment and thought content.   Assessment and Plan:  Pregnancy: G1P0 at [redacted]w[redacted]d 1. Supervision of normal first teen pregnancy in third trimester --Anticipatory guidance about next visits/weeks of pregnancy given.   2. [redacted] weeks gestation of pregnancy   3. Rh negative state in antepartum period, third trimester  - rho (d) immune globulin (RHIG/RHOPHYLAC) injection 300 mcg   Preterm labor symptoms and general obstetric precautions including but not limited to vaginal bleeding, contractions, leaking of fluid and fetal movement were reviewed in detail with the patient. Please refer to After Visit Summary for other counseling recommendations.    No follow-ups on file.  Future Appointments  Date Time Provider [place]  12/28/2022  9:35 AM Elvera Maria, CNM [facility] None  12/31/2022  3:15 PM [facility] NURSE [facility]  12/31/2022  3:30 PM [facility] US3 [facility]  01/11/2023  9:35 AM Leftwich-Kirby, Kathie Dike, CNM [facility] None  01/18/2023  9:35 AM Inez Catalina, MD [facility] None  01/25/2023  9:35 AM Leftwich-Kirby, Kathie Dike, CNM [facility] None    Fatima Blank, CNM

## 2022-12-28 ENCOUNTER — Ambulatory Visit (INDEPENDENT_AMBULATORY_CARE_PROVIDER_SITE_OTHER)

## 2022-12-28 VITALS — BP 110/69 | HR 79 | Wt 152.6 lb

## 2022-12-28 DIAGNOSIS — Z3403 Encounter for supervision of normal first pregnancy, third trimester: Secondary | ICD-10-CM

## 2022-12-28 DIAGNOSIS — O26893 Other specified pregnancy related conditions, third trimester: Secondary | ICD-10-CM

## 2022-12-28 DIAGNOSIS — Z6791 Unspecified blood type, Rh negative: Secondary | ICD-10-CM

## 2022-12-28 DIAGNOSIS — Z3A34 34 weeks gestation of pregnancy: Secondary | ICD-10-CM

## 2022-12-28 HISTORY — DX: Unspecified blood type, rh negative: Z67.91

## 2022-12-28 NOTE — Progress Notes (Signed)
   PRENATAL VISIT NOTE  Subjective:  Katie Stevenson is a 22 y.o. G1P0 at [redacted]w[redacted]d being seen today for ongoing prenatal care.  She is currently monitored for the following issues for this high-risk pregnancy and has Supervision of normal first teen pregnancy; Rh D negative blood type; and Small for gestational age (SGA) on their problem list.  Patient reports no complaints.  Contractions: Not present. Vag. Bleeding: None.  Movement: Present. Denies leaking of fluid.   The following portions of the patient's history were reviewed and updated as appropriate: allergies, current medications, past family history, past medical history, past social history, past surgical history and problem list.   Objective:   Vitals:   12/28/22 0958  BP: 110/69  Pulse: 79  Weight: 152 lb 9.6 oz (69.2 kg)    Fetal Status: Fetal Heart Rate (bpm): 154 Fundal Height: 32 cm Movement: Present     General:  Alert, oriented and cooperative. Patient is in no acute distress.  Skin: Skin is warm and dry. No rash noted.   Cardiovascular: Normal heart rate noted  Respiratory: Normal respiratory effort, no problems with respiration noted  Abdomen: Soft, gravid, appropriate for gestational age.  Pain/Pressure: Absent     Pelvic: Cervical exam deferred        Extremities: Normal range of motion.  Edema: None  Mental Status: Normal mood and affect. Normal behavior. Normal judgment and thought content.   Assessment and Plan:  Pregnancy: G1P0 at [redacted]w[redacted]d 1. Supervision of normal first teen pregnancy in third trimester -Routine care -Anticipatory guidance of 36 week visit with GBS reviewed  2. [redacted] weeks gestation of pregnancy   3. Rh negative state in antepartum period, third trimester -Rhogam 1/16  4. Small for gestational age (SGA) -15% on last scan. Repeat on 2/2. Will manage per MFM recommendations  Preterm labor symptoms and general obstetric precautions including but not limited to vaginal bleeding, contractions,  leaking of fluid and fetal movement were reviewed in detail with the patient. Please refer to After Visit Summary for other counseling recommendations.   Return in about 2 weeks (around 01/11/2023) for GBS, Return OB visit.  Future Appointments  Date Time Provider [place]  12/31/2022  3:15 PM [facility] NURSE [facility]  12/31/2022  3:30 PM [facility] US3 [facility]  01/11/2023  9:35 AM Leftwich-Kirby, Kathie Dike, CNM [facility] None  01/18/2023  9:35 AM Inez Catalina, MD [place] None  01/25/2023  9:35 AM Leftwich-Kirby, Kathie Dike, CNM [facility] None    Wende Mott, [place] 12/28/22 10:13 AM

## 2022-12-28 NOTE — Progress Notes (Signed)
Pt presents for ROB visit. No concerns at this time.  

## 2022-12-28 NOTE — Patient Instructions (Signed)

## 2022-12-31 ENCOUNTER — Ambulatory Visit: Attending: Obstetrics

## 2022-12-31 ENCOUNTER — Other Ambulatory Visit: Payer: Self-pay | Admitting: Maternal & Fetal Medicine

## 2022-12-31 ENCOUNTER — Telehealth: Payer: Self-pay

## 2022-12-31 ENCOUNTER — Other Ambulatory Visit: Payer: Self-pay | Admitting: Obstetrics

## 2022-12-31 ENCOUNTER — Ambulatory Visit: Admitting: *Deleted

## 2022-12-31 VITALS — BP 121/73 | HR 89

## 2022-12-31 DIAGNOSIS — Z3A34 34 weeks gestation of pregnancy: Secondary | ICD-10-CM

## 2022-12-31 DIAGNOSIS — Z362 Encounter for other antenatal screening follow-up: Secondary | ICD-10-CM | POA: Insufficient documentation

## 2022-12-31 DIAGNOSIS — Z3689 Encounter for other specified antenatal screening: Secondary | ICD-10-CM | POA: Diagnosis present

## 2022-12-31 DIAGNOSIS — O09893 Supervision of other high risk pregnancies, third trimester: Secondary | ICD-10-CM

## 2022-12-31 DIAGNOSIS — O365930 Maternal care for other known or suspected poor fetal growth, third trimester, not applicable or unspecified: Secondary | ICD-10-CM

## 2022-12-31 DIAGNOSIS — O35BXX0 Maternal care for other (suspected) fetal abnormality and damage, fetal cardiac anomalies, not applicable or unspecified: Secondary | ICD-10-CM

## 2022-12-31 NOTE — Telephone Encounter (Signed)
Spoke with patient - is aware of next Friday 01/07/23 ultrasound appointment - still need to find something for Friday 01/14/23

## 2023-01-07 ENCOUNTER — Other Ambulatory Visit: Payer: Self-pay

## 2023-01-07 ENCOUNTER — Ambulatory Visit: Admitting: *Deleted

## 2023-01-07 ENCOUNTER — Ambulatory Visit: Attending: Maternal & Fetal Medicine

## 2023-01-07 ENCOUNTER — Ambulatory Visit

## 2023-01-07 VITALS — BP 123/79 | HR 54

## 2023-01-07 DIAGNOSIS — O365930 Maternal care for other known or suspected poor fetal growth, third trimester, not applicable or unspecified: Secondary | ICD-10-CM

## 2023-01-07 DIAGNOSIS — Z3A35 35 weeks gestation of pregnancy: Secondary | ICD-10-CM | POA: Insufficient documentation

## 2023-01-07 DIAGNOSIS — O35BXX0 Maternal care for other (suspected) fetal abnormality and damage, fetal cardiac anomalies, not applicable or unspecified: Secondary | ICD-10-CM

## 2023-01-07 DIAGNOSIS — O4443 Low lying placenta NOS or without hemorrhage, third trimester: Secondary | ICD-10-CM | POA: Diagnosis not present

## 2023-01-07 DIAGNOSIS — Z3403 Encounter for supervision of normal first pregnancy, third trimester: Secondary | ICD-10-CM

## 2023-01-07 NOTE — Procedures (Addendum)
Monna Wurster 12-21-2000 [redacted]w[redacted]d Fetus A Non-Stress Test Interpretation for 01/07/23  Indication: IUGR  Fetal Heart Rate A Mode: External Baseline Rate (A): 130 bpm Variability: Moderate Accelerations: 15 x 15 Decelerations: None Multiple birth?: No  Uterine Activity Mode: Palpation, Toco Contraction Frequency (min): occ Contraction Duration (sec): 40 Contraction Quality: Mild Resting Tone Palpated: Relaxed Resting Time: Adequate  Interpretation (Fetal Testing) Nonstress Test Interpretation: Reactive Overall Impression: Reassuring for gestational age Comments: Dr. SEpimenio Sarinreviewed tracing

## 2023-01-11 ENCOUNTER — Encounter: Payer: Self-pay | Admitting: Obstetrics and Gynecology

## 2023-01-11 ENCOUNTER — Ambulatory Visit (INDEPENDENT_AMBULATORY_CARE_PROVIDER_SITE_OTHER): Admitting: Obstetrics and Gynecology

## 2023-01-11 ENCOUNTER — Other Ambulatory Visit (HOSPITAL_COMMUNITY)
Admission: RE | Admit: 2023-01-11 | Discharge: 2023-01-11 | Disposition: A | Discharge status: Home / Self Care | Source: Ambulatory Visit | Attending: Advanced Practice Midwife | Admitting: Advanced Practice Midwife

## 2023-01-11 VITALS — BP 113/73 | HR 77 | Wt 156.6 lb

## 2023-01-11 DIAGNOSIS — Z3403 Encounter for supervision of normal first pregnancy, third trimester: Secondary | ICD-10-CM

## 2023-01-11 DIAGNOSIS — Z6791 Unspecified blood type, Rh negative: Secondary | ICD-10-CM

## 2023-01-11 DIAGNOSIS — Z3A36 36 weeks gestation of pregnancy: Secondary | ICD-10-CM

## 2023-01-11 NOTE — Progress Notes (Deleted)
Pt presents for ROB visit.

## 2023-01-11 NOTE — Progress Notes (Signed)
Pt presents for ROB visit. Pt has concerns about due date and being inducted at 37 weeks.

## 2023-01-11 NOTE — Progress Notes (Signed)
   PRENATAL VISIT NOTE  Subjective:  Katie Stevenson is a 22 y.o. G1P0 at [redacted]w[redacted]d being seen today for ongoing prenatal care.  She is currently monitored for the following issues for this high-risk pregnancy and has Supervision of normal first teen pregnancy; Rh D negative blood type; and Small for gestational age (SGA) on their problem list.  Patient reports no complaints.  Contractions: Not present. Vag. Bleeding: None.  Movement: Present. Denies leaking of fluid.   The following portions of the patient's history were reviewed and updated as appropriate: allergies, current medications, past family history, past medical history, past social history, past surgical history and problem list.   Objective:   Vitals:   01/11/23 0942  BP: 113/73  Pulse: 77  Weight: 156 lb 9.6 oz (71 kg)    Fetal Status: Fetal Heart Rate (bpm): 132   Movement: Present     General:  Alert, oriented and cooperative. Patient is in no acute distress.  Skin: Skin is warm and dry. No rash noted.   Cardiovascular: Normal heart rate noted  Respiratory: Normal respiratory effort, no problems with respiration noted  Abdomen: Soft, gravid, appropriate for gestational age.  Pain/Pressure: Absent     Pelvic: Cervical exam performed in the presence of a chaperone        Extremities: Normal range of motion.  Edema: None  Mental Status: Normal mood and affect. Normal behavior. Normal judgment and thought content.   Assessment and Plan:  Pregnancy: G1P0 at [redacted]w[redacted]d 1. [redacted] weeks gestation of pregnancy   2. Supervision of normal first teen pregnancy in third trimester Patient is doing well without complaints Cultures collected Provided information on IOL process  3. Rh D negative blood type S/p rhogam  4. Small for gestational age (SGA) Plan for IOL at 63 weeks per MFM- request placed Follow up ultrasound on 2/15  Preterm labor symptoms and general obstetric precautions including but not limited to vaginal bleeding,  contractions, leaking of fluid and fetal movement were reviewed in detail with the patient. Please refer to After Visit Summary for other counseling recommendations.   No follow-ups on file.  Future Appointments  Date Time Provider [place]  01/13/2023  9:30 AM [facility] NURSE [facility]  01/13/2023  9:45 AM [facility] US7 [facility]  01/13/2023 10:45 AM [facility] NST [facility]  01/18/2023  9:35 AM Inez Catalina, MD [facility] None  01/25/2023  9:35 AM Leftwich-Kirby, Kathie Dike, CNM [facility] None    Mora Bellman, MD

## 2023-01-12 ENCOUNTER — Other Ambulatory Visit: Payer: Self-pay | Admitting: Advanced Practice Midwife

## 2023-01-12 ENCOUNTER — Encounter (HOSPITAL_COMMUNITY): Payer: Self-pay

## 2023-01-12 ENCOUNTER — Telehealth (HOSPITAL_COMMUNITY): Payer: Self-pay | Admitting: *Deleted

## 2023-01-12 LAB — CERVICOVAGINAL ANCILLARY ONLY
Chlamydia: NEGATIVE
Comment: NEGATIVE
Comment: NORMAL
Neisseria Gonorrhea: NEGATIVE

## 2023-01-12 NOTE — Telephone Encounter (Signed)
Preadmission screen  

## 2023-01-13 ENCOUNTER — Ambulatory Visit

## 2023-01-13 ENCOUNTER — Ambulatory Visit (HOSPITAL_BASED_OUTPATIENT_CLINIC_OR_DEPARTMENT_OTHER)

## 2023-01-13 ENCOUNTER — Telehealth (HOSPITAL_COMMUNITY): Payer: Self-pay | Admitting: *Deleted

## 2023-01-13 ENCOUNTER — Encounter (HOSPITAL_COMMUNITY): Payer: Self-pay | Admitting: *Deleted

## 2023-01-13 ENCOUNTER — Ambulatory Visit: Admitting: *Deleted

## 2023-01-13 VITALS — BP 110/67 | HR 96

## 2023-01-13 DIAGNOSIS — Z3403 Encounter for supervision of normal first pregnancy, third trimester: Secondary | ICD-10-CM

## 2023-01-13 DIAGNOSIS — O365930 Maternal care for other known or suspected poor fetal growth, third trimester, not applicable or unspecified: Secondary | ICD-10-CM | POA: Insufficient documentation

## 2023-01-13 DIAGNOSIS — Z3A36 36 weeks gestation of pregnancy: Secondary | ICD-10-CM | POA: Diagnosis not present

## 2023-01-13 DIAGNOSIS — O35BXX0 Maternal care for other (suspected) fetal abnormality and damage, fetal cardiac anomalies, not applicable or unspecified: Secondary | ICD-10-CM | POA: Diagnosis not present

## 2023-01-13 DIAGNOSIS — O283 Abnormal ultrasonic finding on antenatal screening of mother: Secondary | ICD-10-CM | POA: Insufficient documentation

## 2023-01-13 NOTE — Telephone Encounter (Signed)
Preadmission screen  

## 2023-01-13 NOTE — Procedures (Signed)
Katie Stevenson Apr 08, 2001 [redacted]w[redacted]d Fetus A Non-Stress Test Interpretation for 01/13/23  Indication: IUGR  Fetal Heart Rate A Mode: External Baseline Rate (A): 135 bpm Variability: Moderate Accelerations: 15 x 15 Decelerations: None Multiple birth?: No  Uterine Activity Mode: Palpation, Toco Contraction Frequency (min): 2 UC Contraction Duration (sec): 40-60 Contraction Quality: Mild Resting Tone Palpated: Relaxed Resting Time: Adequate  Interpretation (Fetal Testing) Nonstress Test Interpretation: Reactive Overall Impression: Reassuring for gestational age Comments: Tracing reviewed by Dr SDonalee Citrin

## 2023-01-15 ENCOUNTER — Inpatient Hospital Stay (EMERGENCY_DEPARTMENT_HOSPITAL)
Admission: AD | Admit: 2023-01-15 | Discharge: 2023-01-15 | Disposition: A | Discharge status: Home / Self Care | Source: Home / Self Care | Attending: Family Medicine | Admitting: Family Medicine

## 2023-01-15 ENCOUNTER — Encounter (HOSPITAL_COMMUNITY): Payer: Self-pay | Admitting: Family Medicine

## 2023-01-15 DIAGNOSIS — O23593 Infection of other part of genital tract in pregnancy, third trimester: Secondary | ICD-10-CM | POA: Insufficient documentation

## 2023-01-15 DIAGNOSIS — O9882 Other maternal infectious and parasitic diseases complicating childbirth: Secondary | ICD-10-CM | POA: Diagnosis not present

## 2023-01-15 DIAGNOSIS — Z3A36 36 weeks gestation of pregnancy: Secondary | ICD-10-CM

## 2023-01-15 DIAGNOSIS — B3731 Acute candidiasis of vulva and vagina: Secondary | ICD-10-CM | POA: Insufficient documentation

## 2023-01-15 DIAGNOSIS — Z3403 Encounter for supervision of normal first pregnancy, third trimester: Secondary | ICD-10-CM

## 2023-01-15 DIAGNOSIS — Z30017 Encounter for initial prescription of implantable subdermal contraceptive: Secondary | ICD-10-CM | POA: Diagnosis not present

## 2023-01-15 DIAGNOSIS — Z3A37 37 weeks gestation of pregnancy: Secondary | ICD-10-CM | POA: Diagnosis not present

## 2023-01-15 DIAGNOSIS — B9689 Other specified bacterial agents as the cause of diseases classified elsewhere: Secondary | ICD-10-CM | POA: Insufficient documentation

## 2023-01-15 DIAGNOSIS — N888 Other specified noninflammatory disorders of cervix uteri: Secondary | ICD-10-CM | POA: Insufficient documentation

## 2023-01-15 DIAGNOSIS — Z7982 Long term (current) use of aspirin: Secondary | ICD-10-CM | POA: Diagnosis not present

## 2023-01-15 DIAGNOSIS — O98813 Other maternal infectious and parasitic diseases complicating pregnancy, third trimester: Secondary | ICD-10-CM | POA: Insufficient documentation

## 2023-01-15 DIAGNOSIS — O26893 Other specified pregnancy related conditions, third trimester: Secondary | ICD-10-CM | POA: Diagnosis not present

## 2023-01-15 DIAGNOSIS — Z6791 Unspecified blood type, Rh negative: Secondary | ICD-10-CM | POA: Diagnosis not present

## 2023-01-15 DIAGNOSIS — Z3689 Encounter for other specified antenatal screening: Secondary | ICD-10-CM

## 2023-01-15 DIAGNOSIS — O365930 Maternal care for other known or suspected poor fetal growth, third trimester, not applicable or unspecified: Secondary | ICD-10-CM | POA: Diagnosis not present

## 2023-01-15 LAB — CULTURE, BETA STREP (GROUP B ONLY): Strep Gp B Culture: NEGATIVE

## 2023-01-15 MED ORDER — FLUCONAZOLE 150 MG PO TABS: 150.0000 mg | ORAL_TABLET | Freq: Once | ORAL | 0 refills | Status: AC

## 2023-01-15 NOTE — MAU Note (Signed)
Pt says she has VB - noticed  at 0130-when she wiped.  [facility]- [facility] - closed - Tuesday Denies HSV GBS- unsure Last sex- 9 mths ago

## 2023-01-15 NOTE — MAU Provider Note (Signed)
History     CSN: [id number]  Arrival date and time: 01/15/23 0200   Event Date/Time   First Provider Initiated Contact with Patient 01/15/23 [phone]      Chief Complaint  Patient presents with   Vaginal Bleeding    Katie Stevenson is a 22 y.o. G1P0 at 60w6dwho receives care at [facility].  She presents today for vaginal bleeding.  She reports she noted bleeding at 0130 with wiping.  She states that the blood was light to dark red. She is unsure of whether she is continued to have bleeding, but denies sanitary napkin usage. She denies abdominal cramping or contractions.  She also denies vaginal discharge prior to the bleeding.  She endorses fetal movement and denies LOF.   OB History     Gravida  1   Para      Term      Preterm      AB      Living         SAB      IAB      Ectopic      Multiple      Live Births              Past Medical History:  Diagnosis Date   Medical history non-contributory     Past Surgical History:  Procedure Laterality Date   NO PAST SURGERIES      Family History  Problem Relation Age of Onset   Hypertension Mother    Hypertension Father    Diabetes Father     Social History   Tobacco Use   Smoking status: Never   Smokeless tobacco: Never  Vaping Use   Vaping Use: Never used  Substance Use Topics   Alcohol use: Never   Drug use: Never    Allergies: No Known Allergies  Medications Prior to Admission  Medication Sig Dispense Refill Last Dose   aspirin 81 MG chewable tablet Chew 1 tablet (81 mg total) by mouth daily. 30 tablet 5 01/14/2023   Prenatal Vit-Fe Fumarate-FA (PRENATAL VITAMINS PO) Take 1 tablet by mouth daily.   01/14/2023   triamcinolone cream (KENALOG) 0.5 % Use twice daily for 7 days.  Repeat for another 7 days if needed. (Patient not taking: Reported on 12/03/2022) 30 g 0     Review of Systems  Gastrointestinal:  Negative for abdominal pain, nausea and vomiting.  Genitourinary:  Positive for vaginal  bleeding. Negative for difficulty urinating, dysuria and vaginal discharge.   Physical Exam   Blood pressure 127/79, pulse 80, temperature 98.6 F (37 C), temperature source Oral, resp. rate 16, height 5' 6"$  (1.676 m), weight 71.3 kg, last menstrual period 05/02/2022.  Physical Exam Vitals reviewed. Exam conducted with a chaperone present.  Constitutional:      Appearance: Normal appearance.  HENT:     Head: Normocephalic and atraumatic.  Eyes:     Conjunctiva/sclera: Conjunctivae normal.  Cardiovascular:     Rate and Rhythm: Normal rate.  Pulmonary:     Effort: Pulmonary effort is normal. No respiratory distress.  Genitourinary:    Comments: Speculum Exam: -Normal External Genitalia: Non tender, brownish Mcveigh curdy discharge noted at introitus. Labia appear edematous bilaterally. -Vaginal Vault: Pink mucosa with good rugae. Copious amt curdy whitish brown discharge. No active bleeding -Cervix:Pink, no lesions, cysts, or polyps. Friable with bleeding noted at 3'o'clock-Faux swab applied with hemostasis. Visually closed. No active bleeding from os- -Bimanual Exam:  Deferred Musculoskeletal:  Cervical back: Normal range of motion.  Skin:    General: Skin is warm and dry.  Neurological:     Mental Status: She is alert and oriented to person, place, and time.  Psychiatric:        Mood and Affect: Mood normal.        Behavior: Behavior normal.     Fetal Assessment 130 bpm, Mod Var, -Decels, +Accels Toco: Graphs mild, Q1-24mn  MAU Course  No results found for this or any previous visit (from the past 24 hour(s)). No results found.  MDM PE Labs: None EFM Prescription Assessment and Plan  22year old G1P0  SIUP at 36.6 weeks Cat I FT Friable Cervix Candidiasis of Vagina   -POC Reviewed -Exam performed and findings discussed. -Informed of apparent yeast infection. -Discussed treatment with Terazol 7.  Patient's support person reports patient for IOL on Sunday  Feb 18th. -Treatment modified to one time dose of Diflucan.  Patient agreeable. -Cautioned regarding friable cervix and how spotting will be apparent. -Precautions reviewed. -Rx to pharmacy on file. -Encouraged to call primary office or return to MAU if symptoms worsen or with the onset of new symptoms. -Discharged to home in stable condition.  JMaryann ConnersMSN, CNM 01/15/2023, 2:38 AM

## 2023-01-16 ENCOUNTER — Inpatient Hospital Stay (HOSPITAL_COMMUNITY)
Admission: RE | Admit: 2023-01-16 | Discharge: 2023-01-19 | DRG: 806 | Disposition: A | Discharge status: Home / Self Care | Attending: Family Medicine | Admitting: Family Medicine

## 2023-01-16 ENCOUNTER — Encounter (HOSPITAL_COMMUNITY): Payer: Self-pay | Admitting: Obstetrics and Gynecology

## 2023-01-16 ENCOUNTER — Inpatient Hospital Stay (HOSPITAL_COMMUNITY)

## 2023-01-16 ENCOUNTER — Other Ambulatory Visit: Payer: Self-pay

## 2023-01-16 DIAGNOSIS — O9882 Other maternal infectious and parasitic diseases complicating childbirth: Secondary | ICD-10-CM | POA: Diagnosis present

## 2023-01-16 DIAGNOSIS — Z3A37 37 weeks gestation of pregnancy: Secondary | ICD-10-CM

## 2023-01-16 DIAGNOSIS — Z30017 Encounter for initial prescription of implantable subdermal contraceptive: Secondary | ICD-10-CM

## 2023-01-16 DIAGNOSIS — B3731 Acute candidiasis of vulva and vagina: Secondary | ICD-10-CM | POA: Diagnosis present

## 2023-01-16 DIAGNOSIS — Z7982 Long term (current) use of aspirin: Secondary | ICD-10-CM | POA: Diagnosis not present

## 2023-01-16 DIAGNOSIS — O365930 Maternal care for other known or suspected poor fetal growth, third trimester, not applicable or unspecified: Secondary | ICD-10-CM | POA: Diagnosis present

## 2023-01-16 DIAGNOSIS — O26893 Other specified pregnancy related conditions, third trimester: Secondary | ICD-10-CM | POA: Diagnosis present

## 2023-01-16 DIAGNOSIS — Z3403 Encounter for supervision of normal first pregnancy, third trimester: Secondary | ICD-10-CM

## 2023-01-16 DIAGNOSIS — Z3490 Encounter for supervision of normal pregnancy, unspecified, unspecified trimester: Secondary | ICD-10-CM | POA: Diagnosis present

## 2023-01-16 DIAGNOSIS — Z6791 Unspecified blood type, Rh negative: Secondary | ICD-10-CM | POA: Diagnosis not present

## 2023-01-16 LAB — TYPE AND SCREEN
ABO/RH(D): O NEG
Antibody Screen: POSITIVE

## 2023-01-16 LAB — CBC
HCT: 36.2 % (ref 36.0–46.0)
Hemoglobin: 12.3 g/dL (ref 12.0–15.0)
MCH: 31.4 pg (ref 26.0–34.0)
MCHC: 34 g/dL (ref 30.0–36.0)
MCV: 92.3 fL (ref 80.0–100.0)
Platelets: 182 10*3/uL (ref 150–400)
RBC: 3.92 MIL/uL (ref 3.87–5.11)
RDW: 13.5 % (ref 11.5–15.5)
WBC: 6.3 10*3/uL (ref 4.0–10.5)
nRBC: 0 % (ref 0.0–0.2)

## 2023-01-16 MED ORDER — LACTATED RINGERS IV SOLN: INTRAVENOUS | Status: DC

## 2023-01-16 MED ORDER — SOD CITRATE-CITRIC ACID 500-334 MG/5ML PO SOLN: 30.0000 mL | ORAL | Status: DC | PRN

## 2023-01-16 MED ORDER — LACTATED RINGERS IV SOLN: 500.0000 mL | INTRAVENOUS | Status: DC | PRN

## 2023-01-16 MED ORDER — OXYTOCIN BOLUS FROM INFUSION
333.0000 mL | Freq: Once | INTRAVENOUS | Status: AC
  Administered 2023-01-17: 333 mL via INTRAVENOUS

## 2023-01-16 MED ORDER — LIDOCAINE HCL (PF) 1 % IJ SOLN: 30.0000 mL | INTRAMUSCULAR | Status: DC | PRN

## 2023-01-16 MED ORDER — TERBUTALINE SULFATE 1 MG/ML IJ SOLN: 0.2500 mg | Freq: Once | INTRAMUSCULAR | Status: DC | PRN

## 2023-01-16 MED ORDER — ACETAMINOPHEN 325 MG PO TABS
650.0000 mg | ORAL_TABLET | ORAL | Status: DC | PRN
  Administered 2023-01-17: 650 mg via ORAL
  Filled 2023-01-16: qty 2

## 2023-01-16 MED ORDER — ONDANSETRON HCL 4 MG/2ML IJ SOLN
4.0000 mg | Freq: Four times a day (QID) | INTRAMUSCULAR | Status: DC | PRN
  Administered 2023-01-17: 4 mg via INTRAVENOUS
  Filled 2023-01-16: qty 2

## 2023-01-16 MED ORDER — MISOPROSTOL 25 MCG QUARTER TABLET
25.0000 ug | ORAL_TABLET | ORAL | Status: DC | PRN
  Administered 2023-01-16 (×2): 25 ug via VAGINAL
  Filled 2023-01-16 (×2): qty 1

## 2023-01-16 MED ORDER — FENTANYL CITRATE (PF) 100 MCG/2ML IJ SOLN
50.0000 ug | INTRAMUSCULAR | Status: DC | PRN
  Administered 2023-01-16: 50 ug via INTRAVENOUS
  Filled 2023-01-16: qty 2

## 2023-01-16 MED ORDER — FLUCONAZOLE 150 MG PO TABS
150.0000 mg | ORAL_TABLET | Freq: Once | ORAL | Status: AC
  Administered 2023-01-16: 150 mg via ORAL
  Filled 2023-01-16: qty 1

## 2023-01-16 MED ORDER — OXYCODONE-ACETAMINOPHEN 5-325 MG PO TABS: 2.0000 | ORAL_TABLET | ORAL | Status: DC | PRN

## 2023-01-16 MED ORDER — OXYTOCIN-SODIUM CHLORIDE 30-0.9 UT/500ML-% IV SOLN
2.5000 [IU]/h | INTRAVENOUS | Status: DC
  Filled 2023-01-16: qty 500

## 2023-01-16 MED ORDER — OXYCODONE-ACETAMINOPHEN 5-325 MG PO TABS: 1.0000 | ORAL_TABLET | ORAL | Status: DC | PRN

## 2023-01-16 NOTE — Progress Notes (Signed)
Labor Progress Note Katie Stevenson is a 22 y.o. G1P0 at 42w0dpresented for IOL for FGR.  S: Patient sleeping in bed, resting comfortably after IV pain medication.  O:  BP 109/75 (BP Location: Right Arm)   Pulse 62   Temp 98.6 F (37 C) (Oral)   Resp 18   Ht 5' 6"$  (1.676 m)   Wt 70.4 kg   LMP 05/02/2022   BMI 25.03 kg/m  EFM: 140/moderate/15x15, no decels  CVE: Dilation: 1 Effacement (%): Thick Cervical Position: Middle Station: -2 Presentation: Vertex Exam by:: Cresenzo MD   A&P: 22y.o. G1P0 359w0dOL for FGR #Labor: FB still in place. Contractions every 1-2 min so will not start pitocin at this time. Can consider low dose pitocin if contractions space out. #Pain: IV pain medication; epidural upon request #FWB: Cat I #GBS negative  LyDarlen RoundMD PGY-1 9:21 PM

## 2023-01-16 NOTE — Progress Notes (Signed)
Labor Progress Note Katie Stevenson is a 22 y.o. G1P0 at 48w0dpresented for IOL for SGA.  S: Patient is resting comfortably.   O:  BP 115/67   Pulse 79   Temp 98.3 F (36.8 C) (Oral)   Resp 16   Ht 5' 6"$  (1.676 m)   Wt 70.4 kg   LMP 05/02/2022   BMI 25.03 kg/m  EFM: 135/6-25/15x15  CVE: Dilation: Fingertip Effacement (%): Thick Cervical Position: Middle Station: -2 Presentation: Vertex Exam by:: kim fields, rn   A&P: 22y.o. G1P0 361w0d#Labor: Placed FB and 2nd dose of vaginal cytotec.  #Pain: Family support. Planning for epidural. #FWB: Cat 1  #GBS negative  JaArlyce DiceMD [facility] 4:54 PM

## 2023-01-16 NOTE — H&P (Addendum)
OBSTETRIC ADMISSION HISTORY AND PHYSICAL  Katie Stevenson is a 22 y.o. female G1P0 with IUP at 43w0dby LMF presenting for IOL for SGA. She reports +FMs, No LOF, no VB, no blurry vision, headaches or peripheral edema, and RUQ pain.  She plans on breast feeding. For contraception, she is choosing between nexplanon vs IUD.   [facility]  Dating: By LMP --->  Estimated Date of Delivery: 02/06/23  Sono:    @[redacted]w[redacted]d$ , CWD, normal anatomy, cephalic presentation,  4th% EFW  Prenatal History/Complications: IUGR  Past Medical History: Past Medical History:  Diagnosis Date   Medical history non-contributory     Past Surgical History: Past Surgical History:  Procedure Laterality Date   NO PAST SURGERIES      Obstetrical History: OB History     Gravida  1   Para      Term      Preterm      AB      Living         SAB      IAB      Ectopic      Multiple      Live Births              Social History Social History   Socioeconomic History   Marital status: Single    Spouse name: Not on file   Number of children: Not on file   Years of education: Not on file   Highest education level: Not on file  Occupational History   Not on file  Tobacco Use   Smoking status: Never   Smokeless tobacco: Never  Vaping Use   Vaping Use: Never used  Substance and Sexual Activity   Alcohol use: Never   Drug use: Never   Sexual activity: Not Currently    Partners: Male    Birth control/protection: None  Other Topics Concern   Not on file  Social History Narrative   Not on file   Social Determinants of Health   Financial Resource Strain: Not on file  Food Insecurity: No Food Insecurity (01/16/2023)   Hunger Vital Sign    Worried About Running Out of Food in the Last Year: Never true    Ran Out of Food in the Last Year: Never true  Transportation Needs: No Transportation Needs (01/16/2023)   PRAPARE - THydrologist (Medical): No    Lack of Transportation (Non-Medical): No  Physical Activity: Not on file  Stress: Not on file  Social Connections: Not on file    Family History: Family History  Problem Relation Age of Onset   Hypertension Mother    Hypertension Father    Diabetes Father     Allergies: No Known Allergies  Medications Prior to Admission  Medication Sig Dispense Refill Last Dose   aspirin 81 MG chewable tablet Chew 1 tablet (81 mg total) by mouth daily. 30 tablet 5    Prenatal Vit-Fe Fumarate-FA (PRENATAL VITAMINS PO) Take 1 tablet by mouth daily.        Review of Systems   All systems reviewed and negative except as stated in HPI  Blood pressure 115/67, pulse 79, temperature 98.3 F (36.8 C), temperature source Oral, resp. rate 16, height 5' 6"$  (1.676 m), weight 70.4 kg, last menstrual period 05/02/2022. General appearance: alert, cooperative, appears stated age, and no distress Lungs: clear to auscultation bilaterally Heart: regular rate and rhythm Abdomen: soft, non-tender;  bowel sounds normal Pelvic: will be performed by another provider. Extremities: BL LE symmetric, nontender, nonedematous, nonerythematous.  Presentation: cephalic Fetal monitoringBaseline: 135 bpm, Variability: Good {> 6 bpm), Accelerations: Reactive, and Decelerations: Absent Uterine activityFrequency: Every 6 minutes     Prenatal labs: ABO, Rh: O/Negative/-- (08/29 1148) Antibody: Negative (08/29 1148) Rubella: 6.37 (08/29 1148) RPR: Non Reactive (12/19 0821)  HBsAg: Negative (08/29 1148)  HIV: Non Reactive (12/19 [id number])  GBS: Negative/-- (02/13 1019)  Normal 2hr GTT in 3rd trimester Genetic screening  NIPS: LR, AFP neg, Horizon neg Anatomy US: EIF, low lying placenta. Resolved on F/U. EFW 15% on 1/5   Prenatal Transfer Tool  Maternal Diabetes: No Genetic Screening: Normal Maternal Ultrasounds/Referrals: IUGR Fetal Ultrasounds or other Referrals:  None Maternal Substance Abuse:   No Significant Maternal Medications:  None Significant Maternal Lab Results:  Group B Strep negative and Rh negative Number of Prenatal Visits:greater than 3 verified prenatal visits Other Comments:  None  No results found for this or any previous visit (from the past 24 hour(s)).  Patient Active Problem List   Diagnosis Date Noted   Encounter for induction of labor 01/16/2023   Rh D negative blood type 12/28/2022   Small for gestational age (SGA) 12/28/2022   Supervision of normal first teen pregnancy 07/12/2022    Assessment/Plan:  Katie Stevenson is a 22 y.o. G1P0 at 23w0dhere for IOL for SGA.  #Labor: gave 1 dose vaginal Cytotec. Reassess in 4 hours and consider FB #Pain: Plan for epidural #FWB: Category 1 fetal heart tracing #ID:  GBS neg #MOF: Breast #MOC: undecided, choosing between nexplanon vs IUD #Circ:  Yes  Vaginal Candidiasis - clinically diagnosed 2/17 in MAU. Planned for pt to receive one time dose of Diflucan but pt was unable to pick this up, so she has not started treatment yet.  - Plan to tx w/ Diflucan [id number]  JaArlyce DiceMD  01/16/2023, 12:30 PM   Fellow Attestation  I saw and evaluated the patient, performing the key elements of the service.I  personally performed or re-performed the history, physical exam, and medical decision making activities of this service and have verified that the service and findings are accurately documented in the resident's note. I developed the management plan that is described in the resident's note, and I agree with the content, with my edits above.    ViGifford ShaveMD OB Fellow

## 2023-01-17 ENCOUNTER — Inpatient Hospital Stay (HOSPITAL_COMMUNITY): Admitting: Anesthesiology

## 2023-01-17 ENCOUNTER — Encounter (HOSPITAL_COMMUNITY): Payer: Self-pay | Admitting: Obstetrics and Gynecology

## 2023-01-17 DIAGNOSIS — O365930 Maternal care for other known or suspected poor fetal growth, third trimester, not applicable or unspecified: Secondary | ICD-10-CM

## 2023-01-17 DIAGNOSIS — Z3A37 37 weeks gestation of pregnancy: Secondary | ICD-10-CM

## 2023-01-17 LAB — RPR: RPR Ser Ql: NONREACTIVE

## 2023-01-17 MED ORDER — TETANUS-DIPHTH-ACELL PERTUSSIS 5-2.5-18.5 LF-MCG/0.5 IM SUSY: 0.5000 mL | PREFILLED_SYRINGE | Freq: Once | INTRAMUSCULAR | Status: DC

## 2023-01-17 MED ORDER — WITCH HAZEL-GLYCERIN EX PADS: 1.0000 | MEDICATED_PAD | CUTANEOUS | Status: DC | PRN

## 2023-01-17 MED ORDER — LACTATED RINGERS IV SOLN: 500.0000 mL | Freq: Once | INTRAVENOUS | Status: DC

## 2023-01-17 MED ORDER — DIPHENHYDRAMINE HCL 50 MG/ML IJ SOLN: 12.5000 mg | INTRAMUSCULAR | Status: DC | PRN

## 2023-01-17 MED ORDER — BENZOCAINE-MENTHOL 20-0.5 % EX AERO
1.0000 | INHALATION_SPRAY | CUTANEOUS | Status: DC | PRN
  Filled 2023-01-17: qty 56

## 2023-01-17 MED ORDER — RHO D IMMUNE GLOBULIN 1500 UNIT/2ML IJ SOSY
300.0000 ug | PREFILLED_SYRINGE | Freq: Once | INTRAMUSCULAR | Status: AC
  Administered 2023-01-18: 300 ug via INTRAVENOUS
  Filled 2023-01-17: qty 2

## 2023-01-17 MED ORDER — PHENYLEPHRINE 80 MCG/ML (10ML) SYRINGE FOR IV PUSH (FOR BLOOD PRESSURE SUPPORT): 80.0000 ug | PREFILLED_SYRINGE | INTRAVENOUS | Status: DC | PRN

## 2023-01-17 MED ORDER — COCONUT OIL OIL: 1.0000 | TOPICAL_OIL | Status: DC | PRN

## 2023-01-17 MED ORDER — DIPHENHYDRAMINE HCL 25 MG PO CAPS: 25.0000 mg | ORAL_CAPSULE | Freq: Four times a day (QID) | ORAL | Status: DC | PRN

## 2023-01-17 MED ORDER — LIDOCAINE HCL (PF) 1 % IJ SOLN
INTRAMUSCULAR | Status: DC | PRN
  Administered 2023-01-17: 8 mL via EPIDURAL

## 2023-01-17 MED ORDER — IBUPROFEN 600 MG PO TABS
600.0000 mg | ORAL_TABLET | Freq: Four times a day (QID) | ORAL | Status: DC
  Administered 2023-01-17 – 2023-01-19 (×7): 600 mg via ORAL
  Filled 2023-01-17 (×8): qty 1

## 2023-01-17 MED ORDER — PRENATAL MULTIVITAMIN CH
1.0000 | ORAL_TABLET | Freq: Every day | ORAL | Status: DC
  Administered 2023-01-17 – 2023-01-18 (×2): 1 via ORAL
  Filled 2023-01-17 (×3): qty 1

## 2023-01-17 MED ORDER — ONDANSETRON HCL 4 MG PO TABS: 4.0000 mg | ORAL_TABLET | ORAL | Status: DC | PRN

## 2023-01-17 MED ORDER — METHYLERGONOVINE MALEATE 0.2 MG/ML IJ SOLN: 0.2000 mg | Freq: Once | INTRAMUSCULAR | Status: AC

## 2023-01-17 MED ORDER — SENNOSIDES-DOCUSATE SODIUM 8.6-50 MG PO TABS
2.0000 | ORAL_TABLET | ORAL | Status: DC
  Administered 2023-01-17 – 2023-01-18 (×2): 2 via ORAL
  Filled 2023-01-17 (×2): qty 2

## 2023-01-17 MED ORDER — EPHEDRINE 5 MG/ML INJ: 10.0000 mg | INTRAVENOUS | Status: DC | PRN

## 2023-01-17 MED ORDER — FENTANYL-BUPIVACAINE-NACL 0.5-0.125-0.9 MG/250ML-% EP SOLN
12.0000 mL/h | EPIDURAL | Status: DC | PRN
  Administered 2023-01-17: 12 mL/h via EPIDURAL
  Filled 2023-01-17: qty 250

## 2023-01-17 MED ORDER — TERBUTALINE SULFATE 1 MG/ML IJ SOLN: 0.2500 mg | Freq: Once | INTRAMUSCULAR | Status: DC | PRN

## 2023-01-17 MED ORDER — DIBUCAINE (PERIANAL) 1 % EX OINT: 1.0000 | TOPICAL_OINTMENT | CUTANEOUS | Status: DC | PRN

## 2023-01-17 MED ORDER — SIMETHICONE 80 MG PO CHEW: 80.0000 mg | CHEWABLE_TABLET | ORAL | Status: DC | PRN

## 2023-01-17 MED ORDER — ONDANSETRON HCL 4 MG/2ML IJ SOLN: 4.0000 mg | INTRAMUSCULAR | Status: DC | PRN

## 2023-01-17 MED ORDER — MEASLES, MUMPS & RUBELLA VAC IJ SOLR: 0.5000 mL | Freq: Once | INTRAMUSCULAR | Status: DC

## 2023-01-17 MED ORDER — METHYLERGONOVINE MALEATE 0.2 MG/ML IJ SOLN
INTRAMUSCULAR | Status: AC
  Administered 2023-01-17: 0.2 mg via INTRAMUSCULAR
  Filled 2023-01-17: qty 1

## 2023-01-17 MED ORDER — FENTANYL-BUPIVACAINE-NACL 0.5-0.125-0.9 MG/250ML-% EP SOLN: 12.0000 mL/h | EPIDURAL | Status: DC | PRN

## 2023-01-17 MED ORDER — ACETAMINOPHEN 325 MG PO TABS: 650.0000 mg | ORAL_TABLET | ORAL | Status: DC | PRN

## 2023-01-17 MED ORDER — OXYTOCIN-SODIUM CHLORIDE 30-0.9 UT/500ML-% IV SOLN
1.0000 m[IU]/min | INTRAVENOUS | Status: DC
  Administered 2023-01-17: 2 m[IU]/min via INTRAVENOUS

## 2023-01-17 NOTE — Progress Notes (Signed)
Labor Progress Note Novali Seabourn is a 22 y.o. G1P0 at 81w1dpresented for IOL for FGR S: Patient in moderate pain with contractions.   O:  BP 118/80   Pulse 82   Temp 98.9 F (37.2 C) (Oral)   Resp 16   Ht 5' 6"$  (1.676 m)   Wt 70.4 kg   LMP 05/02/2022   BMI 25.03 kg/m  EFM: 135/moderate/+accels, - decels  CVE: Dilation: 5 Effacement (%): 80 Cervical Position: Middle Station: -1 Presentation: Vertex Exam by:: SBurnadette Pop RN   A&P: 22y.o. G1P0 330w1dOL for FGR #Labor: Progressing well. FB out at 0100. Contracting q1-2 min on toco, holding pitocin at this time. Will proceed with epidural and assess for possible AROM at next CE. #Pain: epidural #FWB: Cat I #GBS negative  LyDarlen RoundMD PGY-1 1:14 AM

## 2023-01-17 NOTE — Lactation Note (Signed)
This note was copied from a baby's chart. Lactation Consultation Note  Patient Name: Katie Stevenson [id number] Date: 01/17/2023 Reason for consult: L&D Initial assessment;1st time breastfeeding;Early term 37-38.6wks;Infant < 6lbs Age:20 hours  P1, [redacted]w[redacted]d  Baby on warmer for weight when LKathrynentered room.  Assisted with latching with ease.  Lactation to follow up on MBU.   Feeding Mother's Current Feeding Choice: Breast Milk  LATCH Score Latch: Grasps breast easily, tongue down, lips flanged, rhythmical sucking.  Audible Swallowing: A few with stimulation  Type of Nipple: Everted at rest and after stimulation  Comfort (Breast/Nipple): Soft / non-tender  Hold (Positioning): Assistance needed to correctly position infant at breast and maintain latch.  LATCH Score: 8  Interventions Interventions: Assisted with latch;Skin to skin;Education  Consult Status Consult Status: Follow-up from L&D    BVivianne MasterBUnited Hospital Center2/19/2024, 11:23 AM

## 2023-01-17 NOTE — Progress Notes (Addendum)
Labor Progress Note Rosalind Kollin is a 22 y.o. G1P0 at 32w1dpresented for IOL for FGR S: Patient comfortable since having epidural.   O:  BP 107/72   Pulse 72   Temp 98.1 F (36.7 C) (Oral)   Resp 16   Ht 5' 6"$  (1.676 m)   Wt 70.4 kg   LMP 05/02/2022   SpO2 97%   BMI 25.03 kg/m  EFM: 130/moderate/+accels, - decels  CVE: Dilation: 4.5 Effacement (%): 70 Cervical Position: Middle Station: -1 Presentation: Vertex Exam by:: Crescenzo, MD   A&P: 22y.o. G1P0 340w1dor IOL for FGR #Labor: Slow progression. AROM at 0700 with clear fluid. Starting pitocin. #Pain: Epidural #FWB: Cat I #GBS negative  LyDarlen RoundMD PGY-1 7:04 AM

## 2023-01-17 NOTE — Anesthesia Postprocedure Evaluation (Signed)
Anesthesia Post Note  Patient: Katie Stevenson  Procedure(s) Performed: AN AD HOC LABOR EPIDURAL     Patient location during evaluation: Mother Baby Anesthesia Type: Epidural Level of consciousness: awake and alert Pain management: pain level controlled Vital Signs Assessment: post-procedure vital signs reviewed and stable Respiratory status: spontaneous breathing, nonlabored ventilation and respiratory function stable Cardiovascular status: stable Postop Assessment: no headache, no backache and epidural receding Anesthetic complications: no   No notable events documented.  Last Vitals:  Vitals:   01/17/23 1220 01/17/23 1320  BP: 128/73 107/71  Pulse: 70 72  Resp: 18 18  Temp: 37.2 C 36.8 C  SpO2: 96% 100%    Last Pain:  Vitals:   01/17/23 1320  TempSrc: Oral  PainSc: 0-No pain   Pain Goal:                   Katie Stevenson

## 2023-01-17 NOTE — Lactation Note (Addendum)
This note was copied from a baby's chart. Lactation Consultation Note  Patient Name: Katie Stevenson [id number] Date: 01/17/2023 Reason for consult: Initial assessment;Early term 37-38.6wks;Infant < 6lbs;1st time breastfeeding Age:20 hours Infant with repeated low blood sugars of : 25, 40, 38 see results review, Birth Parent current feeding choice is : breast feeding and supplementing infant with donor breast milk. Birth Parent had finished pumping when LC entered the room, infant was given 4 mls of EBM and 10 mls of donor breast milk using purple and clear slow flow bottle nipple. LC discussed LPTI/ LBW feeding policy, Birth Parent will limit chest feeding if infant latches at breast to 15 minutes or less and if infant doesn't latch after 3-5 minutes to stop and supplement infant with any EBM first and then donor breast milk. Birth Parent knows total feedings should be 30 minutes or less and every 3 hours. Birth Parent was given "Caring For Your Special Baby" by [facility] and RN given "MY Feeding Plan",  card that was placed in basinet. Day 1 to offer 12-14 mls, infant took 14 mls  easily with this feeding, 4 mls of EBM and 10 mls donor milk.  LC discussed infant's input and output and importance of Birth Parent's maternal rest, diet and hydration. Birth Parent was  made aware of O/P services, breastfeeding support groups, community resources, and our phone # for post-discharge questions.    Birth Parent understands that infant's feeding plan may change and that [facility] services will follow up tomorrow and feeding plan may be different.  Current feeding plan:  1- Birth parent will continue to feed infant 8+ times within 24 hours, STS and following LPTI/LBW feeding guidelines. 2- Birth Parent will continue to supplement infant after every feeding if infant latches at breast or not,  with any EBM first and then donor breast milk, supplemental guidelines on card in basinet.  3- Birth Parent will continue to use DEBP  every 3 hours for 15 minutes on initial setting. 4- Birth Parent will call RN/LC if latch assistance is needed.  Maternal Data Does the patient have breastfeeding experience prior to this delivery?: No  Feeding Mother's Current Feeding Choice: Breast Milk and Donor Milk  LATCH Score                    Lactation Tools Discussed/Used Tools: Pump Breast pump type: Double-Electric Breast Pump Pump Education: Setup, frequency, and cleaning;Milk Storage Reason for Pumping: Infant less than 6 lbs, ETI, low blood sugar and not latching at the breast, lethargic Pumping frequency: Birth Parent will continue to pump every 3 hours for 15 minutes on inital setting. Pumped volume: 4 mL  Interventions Interventions: Breast feeding basics reviewed;Skin to skin;DEBP;Education;Pace feeding;LC Services brochure;LPT handout/interventions  Discharge    Consult Status Consult Status: Follow-up Date: 01/18/23 Follow-up type: In-patient    Eulis Canner 01/17/2023, 7:37 PM

## 2023-01-17 NOTE — Discharge Summary (Shared)
Postpartum Discharge Summary  Date of Service updated***     Patient Name: Katie Stevenson DOB: 2001-06-13 MRN: [id number]  Date of admission: 01/16/2023 Delivery date:01/17/2023  Delivering provider: Julianne Handler  Date of discharge: 01/17/2023  Admitting diagnosis: Encounter for induction of labor [Z34.90] Intrauterine pregnancy: [redacted]w[redacted]d    Secondary diagnosis:  Principal Problem:   Encounter for induction of labor Active Problems:   SVD (spontaneous vaginal delivery)  Additional problems: FGR, teen pregnancy    Discharge diagnosis: Term Pregnancy Delivered                                              Post partum procedures:{Postpartum procedures:23558} Augmentation: AROM, Pitocin, Cytotec, and IP Foley Complications: None  Hospital course: Induction of Labor With Vaginal Delivery   22y.o. yo G1P0 at 36w1das admitted to the hospital 01/16/2023 for induction of labor.  Indication for induction:  FGR .  Patient had an labor course uncomplicated. Membrane Rupture Time/Date: 7:00 AM ,01/17/2023   Delivery Method:Vaginal, Spontaneous  Episiotomy: None  Lacerations:  2nd degree;Periurethral  Details of delivery can be found in separate delivery note.  Patient had a postpartum course complicated by***. Patient is discharged home 01/17/23.  Newborn Data: Birth date:01/17/2023  Birth time:10:28 AM  Gender:Female  Living status:Living  Apgars:8 ,9  Weight:   Magnesium Sulfate received: No BMZ received: No Rhophylac:{Rhophylac received:30440032} MMR:N/A T-DaP:Given prenatally Flu: Yes Transfusion:{Transfusion received:30440034}  Physical exam  Vitals:   01/17/23 0930 01/17/23 0934 01/17/23 1000 01/17/23 1030  BP: 105/66  (!) 127/98 (!) 128/93  Pulse: 72  (!) 109 (!) 116  Resp:      Temp:  98.3 F (36.8 C)    TempSrc:  Axillary    SpO2:      Weight:      Height:       General: {Exam; general:21111117} Lochia: {Desc;  appropriate/inappropriate:30686::"appropriate"} Uterine Fundus: {Desc; firm/soft:30687} Incision: {Exam; incision:21111123} DVT Evaluation: {Exam; dvt:2111122} Labs: Lab Results  Component Value Date   WBC 6.3 01/16/2023   HGB 12.3 01/16/2023   HCT 36.2 01/16/2023   MCV 92.3 01/16/2023   PLT 182 01/16/2023      Latest Ref Rng & Units 04/25/2018   11:08 PM  CMP  Glucose 65 - 99 mg/dL 92   BUN 6 - 20 mg/dL 13   Creatinine 0.50 - 1.00 mg/dL 0.87   Sodium 135 - 145 mmol/L 137   Potassium 3.5 - 5.1 mmol/L 4.1   Chloride 101 - 111 mmol/L 106   CO2 22 - 32 mmol/L 22   Calcium 8.9 - 10.3 mg/dL 9.7   Total Protein 6.5 - 8.1 g/dL 6.8   Total Bilirubin 0.3 - 1.2 mg/dL 0.7   Alkaline Phos 50 - 162 U/L 86   AST 15 - 41 U/L 17   ALT 14 - 54 U/L 10    Edinburgh Score:     No data to display           After visit meds:  Allergies as of 01/17/2023   No Known Allergies   Med Rec must be completed prior to using this [facility]*        Discharge home in stable condition Infant Feeding: {Baby feeding:23562} Infant Disposition:{CHL IP OB HOME WITH MOOP:[id number] instruction: per After Visit Summary and Postpartum booklet. Activity: Advance as tolerated. Pelvic rest  for 6 weeks.  Diet: {OB BY:630183 Future Appointments: Future Appointments  Date Time Provider [place]  01/18/2023  9:35 AM Inez Catalina, MD [facility] None  01/25/2023  9:35 AM Leftwich-Kirby, Kathie Dike, CNM [facility] None   Follow up Visit:   Please schedule this patient for a In person postpartum visit in 6 weeks with the following provider: Any provider. Additional Postpartum F/U: none   High risk pregnancy complicated by:  FGR Delivery mode:  Vaginal, Spontaneous  Anticipated Birth Control:  PP Nexplanon placed   01/17/2023 Julianne Handler, CNM

## 2023-01-17 NOTE — Progress Notes (Signed)
Labor Progress Note Katie Stevenson is a 22 y.o. G1P0 at 53w1dpresented for IOL for FGR  S:  Comfortable with epidural. No complaints.   O:  BP 104/60   Pulse 77   Temp 98.2 F (36.8 C) (Oral)   Resp 16   Ht 5' 6"$  (1.676 m)   Wt 70.4 kg   LMP 05/02/2022   SpO2 97%   BMI 25.03 kg/m  EFM: baseline 130 bpm/ mod variability/ + accels/ no decels  Toco/IUPC: 2-3 SVE: deferred Pitocin: 6 mu/min  A/P: 22y.o. G1P0 38w1d1. Labor: latent 2. FWB: Cat II 3. Pain: epidural 4. FGR   Continue Pitocin titration. Anticipate labor progress and SVD.  MeJulianne HandlerCNM 9:30 AM

## 2023-01-17 NOTE — Anesthesia Preprocedure Evaluation (Addendum)
Anesthesia Evaluation  Patient identified by MRN, date of birth, ID band Patient awake    Reviewed: Allergy & Precautions, H&P , NPO status , Patient's Chart, lab work & pertinent test results, reviewed documented beta blocker date and time   Airway Mallampati: I  TM Distance: >3 FB Neck ROM: full    Dental no notable dental hx. (+) Teeth Intact, Dental Advisory Given   Pulmonary neg pulmonary ROS   Pulmonary exam normal breath sounds clear to auscultation       Cardiovascular negative cardio ROS Normal cardiovascular exam Rhythm:regular Rate:Normal     Neuro/Psych negative neurological ROS  negative psych ROS   GI/Hepatic negative GI ROS, Neg liver ROS,,,  Endo/Other  negative endocrine ROS    Renal/GU negative Renal ROS  negative genitourinary   Musculoskeletal   Abdominal   Peds  Hematology negative hematology ROS (+)   Anesthesia Other Findings   Reproductive/Obstetrics (+) Pregnancy                             Anesthesia Physical Anesthesia Plan  ASA: 2  Anesthesia Plan: Epidural   Post-op Pain Management: Minimal or no pain anticipated   Induction: Intravenous  PONV Risk Score and Plan: 2  Airway Management Planned: Natural Airway  Additional Equipment: None  Intra-op Plan:   Post-operative Plan:   Informed Consent: I have reviewed the patients History and Physical, chart, labs and discussed the procedure including the risks, benefits and alternatives for the proposed anesthesia with the patient or authorized representative who has indicated his/her understanding and acceptance.     Dental Advisory Given  Plan Discussed with: Anesthesiologist  Anesthesia Plan Comments: (Labs checked- platelets confirmed with RN in room. Fetal heart tracing, per RN, reported to be stable enough for sitting procedure. Discussed epidural, and patient consents to the procedure:   included risk of possible headache,backache, failed block, allergic reaction, and nerve injury. This patient was asked if she had any questions or concerns before the procedure started.)       Anesthesia Quick Evaluation

## 2023-01-17 NOTE — Lactation Note (Signed)
This note was copied from a baby's chart. Lactation Consultation Note  Patient Name: Boy Ankita Fabiano [id number] Date: 01/17/2023 Reason for consult: Initial assessment;1st time breastfeeding;Early term 37-38.6wks Age:20 hours  Baby [redacted]w[redacted]d  Called to room to assist with breastfeeding. Mother states baby recently breastfed for 15 min and mother was eating lunch.  RN came into room to give baby glucose gel to to BS 25 and also has temperature instability. Set up DEBP.  Mother will need teaching.   Mother has hands free pump. Recommend feeding baby q 2-3 hours.    Maternal Data Does the patient have breastfeeding experience prior to this delivery?: No  Feeding Mother's Current Feeding Choice: Breast Milk  LATCH Score Latch: Grasps breast easily, tongue down, lips flanged, rhythmical sucking.  Audible Swallowing: A few with stimulation  Type of Nipple: Everted at rest and after stimulation  Comfort (Breast/Nipple): Soft / non-tender  Hold (Positioning): Assistance needed to correctly position infant at breast and maintain latch.  LATCH Score: 8   Lactation Tools Discussed/Used Tools: Pump Breast pump type: Double-Electric Breast Pump Reason for Pumping: stimulation and supplementation  Interventions Interventions: Assisted with latch;Skin to skin;Education  Discharge Pump: Personal  Consult Status Consult Status: Follow-up Date: 01/18/23 Follow-up type: In-patient    [facility]/19/2024, 1:57 PM

## 2023-01-17 NOTE — Plan of Care (Signed)
  Problem: Education: Goal: Knowledge of General Education information will improve Description: Including pain rating scale, medication(s)/side effects and non-pharmacologic comfort measures Outcome: Progressing   Problem: Health Behavior/Discharge Planning: Goal: Ability to manage health-related needs will improve Outcome: Progressing   Problem: Clinical Measurements: Goal: Ability to maintain clinical measurements within normal limits will improve Outcome: Progressing Goal: Will remain free from infection Outcome: Progressing Goal: Diagnostic test results will improve Outcome: Progressing Goal: Respiratory complications will improve Outcome: Progressing Goal: Cardiovascular complication will be avoided Outcome: Progressing   Problem: Activity: Goal: Risk for activity intolerance will decrease Outcome: Progressing   Problem: Nutrition: Goal: Adequate nutrition will be maintained Outcome: Progressing   Problem: Coping: Goal: Level of anxiety will decrease Outcome: Progressing   Problem: Elimination: Goal: Will not experience complications related to bowel motility Outcome: Progressing Goal: Will not experience complications related to urinary retention Outcome: Progressing   Problem: Pain Managment: Goal: General experience of comfort will improve Outcome: Progressing   Problem: Safety: Goal: Ability to remain free from injury will improve Outcome: Progressing   Problem: Skin Integrity: Goal: Risk for impaired skin integrity will decrease Outcome: Progressing   Problem: Education: Goal: Knowledge of condition will improve Outcome: Progressing Goal: Individualized Educational Video(s) Outcome: Progressing Goal: Individualized Newborn Educational Video(s) Outcome: Progressing   Problem: Activity: Goal: Will verbalize the importance of balancing activity with adequate rest periods Outcome: Progressing Goal: Ability to tolerate increased activity will  improve Outcome: Progressing   Problem: Coping: Goal: Ability to identify and utilize available resources and services will improve Outcome: Progressing   Problem: Life Cycle: Goal: Chance of risk for complications during the postpartum period will decrease Outcome: Progressing   Problem: Role Relationship: Goal: Ability to demonstrate positive interaction with newborn will improve Outcome: Progressing   Problem: Skin Integrity: Goal: Demonstration of wound healing without infection will improve Outcome: Progressing   Problem: Education: Goal: Knowledge of condition will improve Outcome: Progressing Goal: Individualized Educational Video(s) Outcome: Progressing Goal: Individualized Newborn Educational Video(s) Outcome: Progressing   Problem: Activity: Goal: Will verbalize the importance of balancing activity with adequate rest periods Outcome: Progressing Goal: Ability to tolerate increased activity will improve Outcome: Progressing   Problem: Coping: Goal: Ability to identify and utilize available resources and services will improve Outcome: Progressing   Problem: Life Cycle: Goal: Chance of risk for complications during the postpartum period will decrease Outcome: Progressing   Problem: Role Relationship: Goal: Ability to demonstrate positive interaction with newborn will improve Outcome: Progressing   Problem: Skin Integrity: Goal: Demonstration of wound healing without infection will improve Outcome: Progressing

## 2023-01-17 NOTE — Anesthesia Procedure Notes (Signed)
Epidural Patient location during procedure: OB Start time: 01/17/2023 1:37 AM End time: 01/17/2023 1:47 AM  Staffing Anesthesiologist: Janeece Riggers, MD  Preanesthetic Checklist Completed: patient identified, IV checked, site marked, risks and benefits discussed, surgical consent, monitors and equipment checked, pre-op evaluation and timeout performed  Epidural Patient position: sitting Prep: DuraPrep and site prepped and draped Patient monitoring: continuous pulse ox and blood pressure Approach: midline Location: L3-L4 Injection technique: LOR air  Needle:  Needle type: Tuohy  Needle gauge: 17 G Needle length: 9 cm and 9 Needle insertion depth: 5 cm Catheter type: closed end flexible Catheter size: 19 Gauge Catheter at skin depth: 10 cm Test dose: negative  Assessment Events: blood not aspirated, no cerebrospinal fluid, injection not painful, no injection resistance, no paresthesia and negative IV test

## 2023-01-17 NOTE — Lactation Note (Signed)
This note was copied from a baby's chart. Lactation Consultation Note  Patient Name: Katie Stevenson [id number] Date: 01/17/2023   Age:20 hours LC entered the room, Birth Parent was eating dinner, she will call when ready for [facility] services, [facility] written name on Brennan board in room.  Maternal Data    Feeding    LATCH Score                    Lactation Tools Discussed/Used    Interventions    Discharge    Consult Status      Eulis Canner 01/17/2023, 5:49 PM

## 2023-01-18 ENCOUNTER — Encounter: Admitting: Obstetrics and Gynecology

## 2023-01-18 MED ORDER — OXYCODONE HCL 5 MG PO TABS
5.0000 mg | ORAL_TABLET | Freq: Once | ORAL | Status: AC
  Administered 2023-01-18: 5 mg via ORAL
  Filled 2023-01-18: qty 1

## 2023-01-18 NOTE — Lactation Note (Signed)
This note was copied from a baby's chart. Lactation Consultation Note  Patient Name: Katie Stevenson [id number] Date: 01/18/2023 Reason for consult:  (Infant is following LPTI / LBW infant feeding plan. Infant is still having hypoglycemia and low temps today see results review) Age:20 hours, weight loss of -3.94%  Per Birth Parent, infant breifly latched for 3 minutes prior to [facility] entering the room, Birth Parent wanted assistance with infant feeding, LC feed infant is side lying position pace feeding infant, infant consumed 15 mls of EBM and 8 mls of donor milk using preemie Nfant Gold nipple it took 18 minutes for the bottle feeding.  Infant total volume with this feeding was 23 mls EBM/Donor milk. This will be Birth Parent's 3 rd time pumping today, Birth Parent will try start pumping every 3 hours for 15 minutes as previously advised, Birth Parent was pumping as LC left the room, Beckett praised Data processing manager for pumping and giving infant her EBM as she continue to work on BF, doing lots of STS to help with infant glucose level and body temperature. Infant was double swaddled in basinet as Birth Parent was pumping. Birth Parent understands that feeding plans for infant may change daily or throughout the day.  Current feeding plan:  1- Continue to limit total infant feeding chest and supplement of EBM/Donor milk to 30 minutes or less per feeding, Day 2 using the [place] for LPTI/LBWE to increase infant's intake or volume to 18-21 mls per feeding or more if infant wants it. Continue to feed infant  2- Continue to do lots of STS with infant and when not doing STS wrap infant in clothing and blankets.  3- Continue to use DEBP every 3 hours for 15 minutes on initial setting and give infant back EBM first before formula.  Maternal Data    Feeding Mother's Current Feeding Choice: Breast Milk and Donor Milk Nipple Type: Nfant Extra Slow Flow (gold)  LATCH Score                    Lactation  Tools Discussed/Used    Interventions Interventions: Education;Pace feeding;Skin to skin  Discharge    Consult Status Consult Status: Follow-up Date: 01/19/23 Follow-up type: In-patient    Eulis Canner 01/18/2023, 5:13 PM

## 2023-01-18 NOTE — Plan of Care (Signed)
  Problem: Education: Goal: Knowledge of General Education information will improve Description: Including pain rating scale, medication(s)/side effects and non-pharmacologic comfort measures Outcome: Progressing   Problem: Health Behavior/Discharge Planning: Goal: Ability to manage health-related needs will improve Outcome: Progressing   Problem: Clinical Measurements: Goal: Ability to maintain clinical measurements within normal limits will improve Outcome: Progressing Goal: Will remain free from infection Outcome: Progressing Goal: Diagnostic test results will improve Outcome: Progressing Goal: Respiratory complications will improve Outcome: Progressing Goal: Cardiovascular complication will be avoided Outcome: Progressing   Problem: Activity: Goal: Risk for activity intolerance will decrease Outcome: Progressing   Problem: Nutrition: Goal: Adequate nutrition will be maintained Outcome: Progressing   Problem: Coping: Goal: Level of anxiety will decrease Outcome: Progressing   Problem: Elimination: Goal: Will not experience complications related to bowel motility Outcome: Progressing Goal: Will not experience complications related to urinary retention Outcome: Progressing   Problem: Pain Managment: Goal: General experience of comfort will improve Outcome: Progressing   Problem: Safety: Goal: Ability to remain free from injury will improve Outcome: Progressing   Problem: Skin Integrity: Goal: Risk for impaired skin integrity will decrease Outcome: Progressing   Problem: Education: Goal: Knowledge of condition will improve Outcome: Progressing Goal: Individualized Educational Video(s) Outcome: Progressing Goal: Individualized Newborn Educational Video(s) Outcome: Progressing   Problem: Activity: Goal: Will verbalize the importance of balancing activity with adequate rest periods Outcome: Progressing Goal: Ability to tolerate increased activity will  improve Outcome: Progressing   Problem: Coping: Goal: Ability to identify and utilize available resources and services will improve Outcome: Progressing   Problem: Life Cycle: Goal: Chance of risk for complications during the postpartum period will decrease Outcome: Progressing   Problem: Role Relationship: Goal: Ability to demonstrate positive interaction with newborn will improve Outcome: Progressing   Problem: Skin Integrity: Goal: Demonstration of wound healing without infection will improve Outcome: Progressing   Problem: Education: Goal: Knowledge of condition will improve Outcome: Progressing Goal: Individualized Educational Video(s) Outcome: Progressing Goal: Individualized Newborn Educational Video(s) Outcome: Progressing   Problem: Activity: Goal: Will verbalize the importance of balancing activity with adequate rest periods Outcome: Progressing Goal: Ability to tolerate increased activity will improve Outcome: Progressing   Problem: Coping: Goal: Ability to identify and utilize available resources and services will improve Outcome: Progressing   Problem: Life Cycle: Goal: Chance of risk for complications during the postpartum period will decrease Outcome: Progressing   Problem: Role Relationship: Goal: Ability to demonstrate positive interaction with newborn will improve Outcome: Progressing   Problem: Skin Integrity: Goal: Demonstration of wound healing without infection will improve Outcome: Progressing

## 2023-01-18 NOTE — Progress Notes (Addendum)
POSTPARTUM PROGRESS NOTE  Post Partum Day 1  Subjective:  Katie Stevenson is a 22 y.o. G1P1001 s/p IOL for FGR at [redacted]w[redacted]d  She reports she is doing well. No acute events overnight. She denies any problems with ambulating, voiding or po intake. Denies nausea or vomiting.  Pain is well controlled.  Lochia is less than menses.  Objective: Blood pressure 105/76, pulse 67, temperature 97.7 F (36.5 C), temperature source Oral, resp. rate 16, height 5' 6"$  (1.676 m), weight 70.4 kg, last menstrual period 05/02/2022, SpO2 97 %, unknown if currently breastfeeding.  Physical Exam:  General: alert, cooperative and no distress Chest: no respiratory distress Heart:regular rate, distal pulses intact Abdomen: soft, nontender,  Uterine Fundus: firm, appropriately tender DVT Evaluation: No calf swelling or tenderness Extremities: no edema Skin: warm, dry  Recent Labs    01/16/23 1224  HGB 12.3  HCT 36.2    Assessment/Plan: Katie Malwitzis a 22y.o. G1P1001 s/p IOL for FGR at 356w1d PPD#1 - Doing well  Routine postpartum care Contraception: Nexplanon Feeding: Breast Dispo: Plan for discharge tomorrow.  Discussed Pre-procedural Circumcision Counseling given parent desires circumcision for this female infant. Circumcision procedure details discussed, risks and benefits of procedure were also discussed.  The benefits include but are not limited to: reduction in the rates of urinary tract infection (UTI), penile cancer, sexually transmitted infections including HIV, penile inflammatory and retractile disorders.  Circumcision also helps obtain better and easier hygiene of the penis.  Risks include but are not limited to: bleeding, infection, injury of glans which may lead to penile deformity or urinary tract issues or Urology intervention, unsatisfactory cosmetic appearance and other potential complications related to the procedure.  It was emphasized that this is an elective procedure.  Written informed  consent was obtained.    LOS: 2 days   Katie Stevenson PGY-1 01/18/2023, 7:47 AM  ___ GME ATTESTATION:  Evaluation and management procedures were performed by the [facility] Resident under my supervision. I was immediately available for direct supervision, assistance and direction throughout this encounter.  I also confirm that I have verified the information documented in the resident's note, and that I have also personally reperformed the pertinent components of the physical exam and all of the medical decision making activities.  I have also made any necessary editorial changes.  Katie Stevenson OB Fellow, [facility] 01/18/2023 7:59 AM

## 2023-01-19 ENCOUNTER — Encounter (HOSPITAL_COMMUNITY): Payer: Self-pay | Admitting: Obstetrics and Gynecology

## 2023-01-19 ENCOUNTER — Other Ambulatory Visit (HOSPITAL_COMMUNITY): Payer: Self-pay

## 2023-01-19 DIAGNOSIS — Z30017 Encounter for initial prescription of implantable subdermal contraceptive: Secondary | ICD-10-CM

## 2023-01-19 HISTORY — PX: NEXPLANON TRAY: NUR84248

## 2023-01-19 LAB — RH IG WORKUP (INCLUDES ABO/RH)
Fetal Screen: NEGATIVE
Gestational Age(Wks): 37
Unit division: 0

## 2023-01-19 MED ORDER — LIDOCAINE HCL 1 % IJ SOLN
0.0000 mL | Freq: Once | INTRAMUSCULAR | Status: DC | PRN
  Filled 2023-01-19: qty 20

## 2023-01-19 MED ORDER — ACETAMINOPHEN 325 MG PO TABS
650.0000 mg | ORAL_TABLET | ORAL | 0 refills | Status: AC | PRN
  Filled 2023-01-19: qty 30, 3d supply, fill #0

## 2023-01-19 MED ORDER — IBUPROFEN 600 MG PO TABS
600.0000 mg | ORAL_TABLET | Freq: Four times a day (QID) | ORAL | 0 refills | Status: AC
  Filled 2023-01-19: qty 30, 8d supply, fill #0

## 2023-01-19 MED ORDER — ETONOGESTREL 68 MG ~~LOC~~ IMPL
68.0000 mg | DRUG_IMPLANT | Freq: Once | SUBCUTANEOUS | Status: AC
  Administered 2023-01-19: 68 mg via SUBCUTANEOUS
  Filled 2023-01-19: qty 1

## 2023-01-19 NOTE — Procedures (Signed)
  Nexplanon Insertion Procedure Patient identified, informed consent performed, consent signed.   Patient does understand that irregular bleeding is a very common side effect of this medication. She was advised to have backup contraception for one week after placement. Pregnancy test in clinic today was negative.  Appropriate time out taken.    Patient's left arm was prepped and draped in the usual sterile fashion. The ruler used to measure and mark insertion area.  Patient was prepped with alcohol swab and then injected with 3 ml of 1% lidocaine.  She was prepped with betadine, Nexplanon removed from packaging,  Device confirmed in needle, then inserted full length of needle and withdrawn per handbook instructions. Nexplanon was able to palpated in the patient's arm; patient palpated the insert herself. There was minimal blood loss.  Patient insertion site covered with guaze and a pressure bandage to reduce any bruising.    The patient tolerated the procedure well and was given post procedure instructions.   Lot no: [id number] Expiration date: 09/2024  Procedure performed by PGY-1, Arlyce Dice and supervised directly by myself.  Liliane Channel MD MPH OB Fellow, [place] for [place] 01/19/2023

## 2023-01-19 NOTE — Progress Notes (Signed)
Patient screened out for psychosocial assessment since none of the following apply:  Psychosocial stressors documented in mother or baby's chart  Gestation less than 32 weeks  Code at delivery   Infant with anomalies Please contact the Clinical Social Worker if specific needs arise, by MOB's request, or if MOB scores greater than 9/yes to question 10 on Edinburgh Postpartum Depression Screen.  Pedrohenrique Mccon[place], LCSW Clinical Social Worker [facility] Cell#: [phone]     

## 2023-01-22 ENCOUNTER — Ambulatory Visit: Payer: Self-pay

## 2023-01-22 NOTE — Lactation Note (Signed)
This note was copied from a baby's chart. Lactation Consultation Note  Patient Name: Katie Stevenson [id number] Date: 01/22/2023   Age 20 hrs  LC in to visit with P1 53 yr old Mom of ET infant transferred to NICU for hypoglycemia.  Baby's birth weight is 2540 gm.    Mom had been set up with a pump and states she is consistently pumping, last pump she expressed 16 ml which she took to NICU.  Reviewed pumping process and importance of STS with baby when allowed in the NICU.    Mom has a hand's free pump at home.  Mom agreeable to [facility] submitting a STORK pump request.  Spectra DEBP was provided by [place].  Encouraged consistent pumping to support a full milk supply.  Mom aware of lactation support available and encouraged her to ask for help prn   Broadus John 01/22/2023, 8:41 AM

## 2023-01-25 ENCOUNTER — Encounter: Admitting: Advanced Practice Midwife

## 2023-01-27 ENCOUNTER — Telehealth (HOSPITAL_COMMUNITY): Payer: Self-pay | Admitting: *Deleted

## 2023-01-27 NOTE — Telephone Encounter (Signed)
Hospital Discharge Follow-Up Call:  Patient reports that she is well and has no concerns about her healing process.  EPDS today was 5 and she endorses this accurately reflects that she is doing well emotionally.  Patient says that baby came home from NICU on 01-22-23 and is doing well.  She has no concerns about his health.  She reports that baby sleeps in a bassinet.  Reviewed ABCs of Safe Sleep.

## 2023-03-02 ENCOUNTER — Ambulatory Visit (INDEPENDENT_AMBULATORY_CARE_PROVIDER_SITE_OTHER): Admitting: Obstetrics and Gynecology

## 2023-03-02 ENCOUNTER — Encounter: Payer: Self-pay | Admitting: Obstetrics and Gynecology

## 2023-03-02 DIAGNOSIS — G8929 Other chronic pain: Secondary | ICD-10-CM

## 2023-03-02 DIAGNOSIS — R519 Headache, unspecified: Secondary | ICD-10-CM

## 2023-03-02 NOTE — Progress Notes (Signed)
    [place] Partum Visit Note  Katie Stevenson is a 22 y.o. G34P1001 female who presents for a postpartum visit. She is 6 weeks postpartum following a normal spontaneous vaginal delivery.  I have fully reviewed the prenatal and intrapartum course. The delivery was at 37.1 gestational weeks.  Anesthesia: epidural. Postpartum course has been . Baby is doing well. Baby is feeding by both breast and bottle - Similac Neosure. Bleeding thin lochia. Bowel function is normal. Bladder function is normal. Patient is not sexually active. Contraception method is Nexplanon.   Postpartum depression screening: negative, score 0.  Pt states she has been having severe HA's since delivery. Pt has taken Tylenol/Ibuprofen with little relief.   The pregnancy intention screening data noted above was reviewed. Potential methods of contraception were discussed. The patient elected to proceed with No data recorded.    Health Maintenance Due  Topic Date Due   HPV VACCINES (1 - 2-dose series) Never done   COVID-19 Vaccine (3 - 2023-24 season) 07/30/2022    Medical records  Review of Systems Pertinent items noted in HPI and remainder of comprehensive ROS otherwise negative.  Objective:  LMP 05/02/2022    General:  alert   Breasts:  not indicated  Lungs: clear to auscultation bilaterally  Heart:  regular rate and rhythm, S1, S2 normal, no murmur, click, rub or gallop  Abdomen: soft, non-tender; bowel sounds normal; no masses,  no organomegaly   Wound NA  GU exam:  not indicated       Assessment:    There are no diagnoses linked to this encounter.  Nl postpartum exam.  Headaches  Plan:   Essential components of care per ACOG recommendations:  1.  Mood and well being: Patient with negative depression screening today. Reviewed local resources for support.  - Patient tobacco use? No.   - hx of drug use? No.    2. Infant care and feeding:  -Patient currently breastmilk feeding? Yes -Social determinants  of health (SDOH) reviewed in EPIC. No concerns  3. Sexuality, contraception and birth spacing - Patient does not want a pregnancy in the next year.  Desired family size is uncertain. - Reviewed reproductive life planning. Reviewed contraceptive methods based on pt preferences and effectiveness.  Patient desired Hormonal Implant today.   - Discussed birth spacing of 18 months  4. Sleep and fatigue -Encouraged family/partner/community support of 4 hrs of uninterrupted sleep to help with mood and fatigue  5. Physical Recovery  - Discussed patients delivery and complications. She describes her labor as good. - Patient had a Vaginal, no problems at delivery. Patient had a 1st degree laceration. Perineal healing reviewed. Patient expressed understanding - Patient has urinary incontinence? No. - Patient is safe to resume physical and sexual activity  6.  Health Maintenance - HM due items addressed Yes - Last pap smear No results found for: "DIAGPAP" Pap smear not done at today's visit.  -Breast Cancer screening indicated? No.   7. Chronic Disease/Pregnancy Condition follow up:  Headaches   Arlina Robes, MD Center for [facility], [place]

## 2024-02-11 ENCOUNTER — Ambulatory Visit
Admission: RE | Admit: 2024-02-11 | Discharge: 2024-02-11 | Disposition: A | Discharge status: Home / Self Care | Payer: Self-pay | Source: Ambulatory Visit | Attending: Family Medicine | Admitting: Family Medicine

## 2024-02-11 VITALS — BP 115/71 | HR 77 | Temp 98.4°F | Resp 16 | Ht 66.0 in | Wt 132.0 lb

## 2024-02-11 DIAGNOSIS — S39012A Strain of muscle, fascia and tendon of lower back, initial encounter: Secondary | ICD-10-CM | POA: Diagnosis not present

## 2024-02-11 MED ORDER — CYCLOBENZAPRINE HCL 5 MG PO TABS: 5.0000 mg | ORAL_TABLET | Freq: Every evening | ORAL | 0 refills | Status: AC | PRN

## 2024-02-11 MED ORDER — NAPROXEN 375 MG PO TABS: 375.0000 mg | ORAL_TABLET | Freq: Two times a day (BID) | ORAL | 0 refills | Status: AC

## 2024-02-11 NOTE — ED Triage Notes (Signed)
 Patient states lower back pain for one week, no known injury, no dysuria.  Taking Tylenol with no releif

## 2024-02-11 NOTE — ED Provider Notes (Signed)
 [facility]  Note:  This document was prepared using Conservation officer, historic buildings and may include unintentional dictation errors.  MRN: [id number] DOB: 10/30/2001  Subjective:   Katie Stevenson is a 23 y.o. female presenting for 1 week history of persistent lower back pain.  No fall, trauma.  No urinary symptoms, hematuria, radicular symptoms.  No changes to bowel habits.  Has been using Tylenol without relief.  Hydrates very well.  Patient does do a lot of intermittent lifting for her work, also stands the entirety of her shifts.  No current facility-administered medications for this encounter.  Current Outpatient Medications:    etonogestrel (NEXPLANON) 68 MG IMPL implant, 1 each by Subdermal route once., Disp: , Rfl:    acetaminophen (TYLENOL) 325 MG tablet, Take 2 tablets (650 mg total) by mouth every 4 (four) hours as needed (for pain scale < 4)., Disp: 30 tablet, Rfl: 0   ibuprofen (ADVIL) 600 MG tablet, Take 1 tablet (600 mg total) by mouth every 6 (six) hours., Disp: 30 tablet, Rfl: 0   Prenatal Vit-Fe Fumarate-FA (PRENATAL VITAMINS PO), Take 1 tablet by mouth daily., Disp: , Rfl:    No Known Allergies  Past Medical History:  Diagnosis Date   Medical history non-contributory    Rh D negative blood type 12/28/2022   Rhopylac 1/16     Past Surgical History:  Procedure Laterality Date   NEXPLANON TRAY  01/19/2023   NO PAST SURGERIES      Family History  Problem Relation Age of Onset   Hypertension Mother    Hypertension Father    Diabetes Father     Social History   Tobacco Use   Smoking status: Never   Smokeless tobacco: Never  Vaping Use   Vaping status: Never Used  Substance Use Topics   Alcohol use: Never   Drug use: Never    ROS   Objective:   Vitals: BP 115/71 (BP Location: Right Arm)   Pulse 77   Temp 98.4 F (36.9 C) (Oral)   Resp 16   Ht 5\' 6"  (1.676 m)   Wt 132 lb (59.9 kg)   SpO2 94%   BMI 21.31 kg/m    Physical Exam Constitutional:      General: She is not in acute distress.    Appearance: Normal appearance. She is well-developed. She is not ill-appearing, toxic-appearing or diaphoretic.  HENT:     Head: Normocephalic and atraumatic.     Nose: Nose normal.     Mouth/Throat:     Mouth: Mucous membranes are moist.  Eyes:     General: No scleral icterus.       Right eye: No discharge.        Left eye: No discharge.     Extraocular Movements: Extraocular movements intact.  Cardiovascular:     Rate and Rhythm: Normal rate.  Pulmonary:     Effort: Pulmonary effort is normal.  Musculoskeletal:     Lumbar back: Spasms and tenderness (lumbar paraspinal muscles) present. No swelling, edema, deformity, signs of trauma, lacerations or bony tenderness. Normal range of motion. Negative right straight leg raise test and negative left straight leg raise test. No scoliosis.  Skin:    General: Skin is warm and dry.  Neurological:     General: No focal deficit present.     Mental Status: She is alert and oriented to person, place, and time.  Psychiatric:        Mood and Affect:  Mood normal.        Behavior: Behavior normal.    Politely declines UPT.  Assessment and Plan :   PDMP not reviewed this encounter.  1. Lumbar strain, initial encounter    Will manage conservatively for back strain with NSAID and muscle relaxant, rest and modification of physical activity.  Anticipatory guidance provided.  Counseled patient on potential for adverse effects with medications prescribed/recommended today, ER and return-to-clinic precautions discussed, patient verbalized understanding.    Wallis Bamberg, PA-C 02/11/24 1102

## 2024-09-13 ENCOUNTER — Encounter (HOSPITAL_COMMUNITY): Payer: Self-pay

## 2024-09-13 ENCOUNTER — Emergency Department (HOSPITAL_COMMUNITY)

## 2024-09-13 ENCOUNTER — Emergency Department (HOSPITAL_COMMUNITY)
Admission: EM | Admit: 2024-09-13 | Discharge: 2024-09-13 | Disposition: A | Discharge status: Home / Self Care | Attending: Emergency Medicine | Admitting: Emergency Medicine

## 2024-09-13 DIAGNOSIS — M79642 Pain in left hand: Secondary | ICD-10-CM | POA: Diagnosis present

## 2024-09-13 DIAGNOSIS — S60222A Contusion of left hand, initial encounter: Secondary | ICD-10-CM | POA: Insufficient documentation

## 2024-09-13 DIAGNOSIS — W231XXA Caught, crushed, jammed, or pinched between stationary objects, initial encounter: Secondary | ICD-10-CM | POA: Diagnosis not present

## 2024-09-13 NOTE — Progress Notes (Signed)
 Orthopedic Tech Progress Note Patient Details:  Katie Stevenson [birth year] [id number]  Ortho Devices Type of Ortho Device: Velcro wrist splint Ortho Device/Splint Location: L WRIST Ortho Device/Splint Interventions: Ordered, Application   Post Interventions Patient Tolerated: Well Instructions Provided: Care of device  Katie Stevenson 09/13/2024, 1:52 AM

## 2024-09-13 NOTE — ED Triage Notes (Signed)
 Pt complaints of left hand pain, hand was caught on closing door. Pain 9 out of 10, difficulty closing hand.  Alert and oriented

## 2024-09-13 NOTE — Discharge Instructions (Addendum)
 Today you were seen for a left hand injury.  Your imaging did not show any fracture or dislocation.  You may alternate Tylenol  and Motrin , elevate, ice, and compress the affected area help with your pain.  Thank you for letting us  treat you today. After reviewing your imaging, I feel you are safe to go home. Please follow up with your PCP in the next several days and provide them with your records from this visit. Return to the Emergency Room if pain becomes severe or symptoms worsen.

## 2024-09-13 NOTE — ED Provider Notes (Signed)
 [place] [facility]MENT AT [facility] Provider Note   CSN: [id number] Arrival date & time: 09/13/24  9985     Patient presents with: Hand Pain   Katie Stevenson is a 23 y.o. female presents today for left hand pain after getting it caught in a closing door.  Patient reports pain with making a fist.  Patient denies numbness, tingling, fever, chills, or open wound.    Hand Pain       Prior to Admission medications   Medication Sig Start Date End Date Taking? Authorizing Provider  acetaminophen  (TYLENOL ) 325 MG tablet Take 2 tablets (650 mg total) by mouth every 4 (four) hours as needed (for pain scale < 4). 01/19/23   Rossi, Lyda, MD  cyclobenzaprine  (FLEXERIL ) 5 MG tablet Take 1 tablet (5 mg total) by mouth at bedtime as needed. 02/11/24   Christopher Savannah, PA-C  etonogestrel  (NEXPLANON ) 68 MG IMPL implant 1 each by Subdermal route once.    [provider]  ibuprofen  (ADVIL ) 600 MG tablet Take 1 tablet (600 mg total) by mouth every 6 (six) hours. 01/19/23   Rossi, Lyda, MD  naproxen  (NAPROSYN ) 375 MG tablet Take 1 tablet (375 mg total) by mouth 2 (two) times daily with a meal. 02/11/24   Christopher Savannah, PA-C  Prenatal Vit-Fe Fumarate-FA (PRENATAL VITAMINS PO) Take 1 tablet by mouth daily.    [provider]    Allergies: Patient has no known allergies.    Review of Systems  Musculoskeletal:  Positive for arthralgias.    Updated Vital Signs BP 122/83 (BP Location: Right Arm)   Pulse 79   Temp 98.2 F (36.8 C)   Resp 16   SpO2 98%   Physical Exam Vitals and nursing note reviewed.  Constitutional:      General: She is not in acute distress.    Appearance: Normal appearance. She is well-developed.  HENT:     Head: Normocephalic and atraumatic.     Right Ear: External ear normal.     Left Ear: External ear normal.     Nose: Nose normal.     Mouth/Throat:     Mouth: Mucous membranes are moist.  Eyes:     Conjunctiva/sclera: Conjunctivae normal.   Cardiovascular:     Rate and Rhythm: Normal rate and regular rhythm.     Heart sounds: No murmur heard. Pulmonary:     Effort: Pulmonary effort is normal. No respiratory distress.     Breath sounds: Normal breath sounds.  Abdominal:     Palpations: Abdomen is soft.     Tenderness: There is no abdominal tenderness.  Musculoskeletal:        General: Swelling, tenderness and signs of injury present.     Cervical back: Neck supple.     Comments: Patient with mild swelling and ecchymosis to the radial aspect of the dorsum of the left hand with associated tenderness to palpation.  Patient has full ROM of the left hand, however there is pain with movement.  Patient is neurovascularly intact with +2 radial pulses.  No open wound, erythema, or nailbed involvement noted on exam.  Skin:    General: Skin is warm and dry.     Capillary Refill: Capillary refill takes less than 2 seconds.     Findings: Bruising present.  Neurological:     Mental Status: She is alert.  Psychiatric:        Mood and Affect: Mood normal.     (all labs ordered are  listed, but only abnormal results are displayed) Labs Reviewed - No data to display  EKG: None  Radiology: DG Hand Complete Left Result Date: 09/13/2024 EXAM: 3 VIEW(S) XRAY OF THE LEFT HAND 09/13/2024 12:31:00 AM COMPARISON: None available. CLINICAL HISTORY: Left hand pain. Brother accidentally slammed left hand in hard door. Pain over index finger and metacarpal. FINDINGS: BONES AND JOINTS: No acute fracture. No focal osseous lesion. No joint dislocation. SOFT TISSUES: The soft tissues are unremarkable. IMPRESSION: 1. No acute osseous abnormality. Electronically signed by: Pinkie Pebbles MD 09/13/2024 12:36 AM EDT RP Workstation: [id number]     Procedures   Medications Ordered in the ED - No data to display                                  Medical Decision Making Amount and/or Complexity of Data Reviewed Radiology: ordered.   This patient  presents to the ED for concern of left hand injury differential diagnosis includes fracture, dislocation, cellulitis, laceration   Imaging Studies ordered:  I ordered imaging studies including left hand x-ray I independently visualized and interpreted imaging which showed no acute osseous abnormality I agree with the radiologist interpretation   Problem List / ED Course:  Considered for admission or further workup however patient's vital signs, physical exam, and imaging are reassuring.  .  Patient symptoms likely due to musculoskeletal pain.  Patient advised to alternate Tylenol /Motrin  as needed for pain.  Patient may also ice affected area.  Patient given return precautions.  I feel patient safe for discharge at this time.      Final diagnoses:  Left hand pain    ED Discharge Orders     None          Francis Ileana LOISE DEVONNA 09/13/24 0124    Trine Raynell Moder, MD 09/13/24 [phone]

## 2024-11-15 ENCOUNTER — Ambulatory Visit
